# Patient Record
Sex: Male | Born: 1995 | Race: Black or African American | Hispanic: No | Marital: Single | Smoking: Never smoker
Health system: Southern US, Community
[De-identification: ages and names within clinical notes are randomized; demographics above are authoritative.]

## PROBLEM LIST (undated history)

## (undated) DIAGNOSIS — I4891 Unspecified atrial fibrillation: Secondary | ICD-10-CM

## (undated) HISTORY — PX: WISDOM TOOTH EXTRACTION: SHX21

## (undated) HISTORY — DX: Unspecified atrial fibrillation: I48.91

## (undated) HISTORY — DX: Morbid (severe) obesity due to excess calories: E66.01

---
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
## 2015-09-16 ENCOUNTER — Other Ambulatory Visit: Payer: Self-pay

## 2015-09-16 NOTE — Progress Notes (Unsigned)
Prom

## 2018-07-04 ENCOUNTER — Ambulatory Visit: Payer: BC Managed Care – PPO | Admitting: Family Medicine

## 2018-11-06 ENCOUNTER — Other Ambulatory Visit: Payer: Self-pay

## 2018-11-06 ENCOUNTER — Ambulatory Visit (INDEPENDENT_AMBULATORY_CARE_PROVIDER_SITE_OTHER): Payer: BC Managed Care – PPO | Admitting: Family Medicine

## 2018-11-06 VITALS — BP 130/88 | Temp 97.6°F | Wt 287.0 lb

## 2018-11-06 DIAGNOSIS — M778 Other enthesopathies, not elsewhere classified: Secondary | ICD-10-CM

## 2018-11-06 NOTE — Progress Notes (Signed)
   Subjective:    Patient ID: Hunter Simmons, male    DOB: Jul 06, 1995, 23 y.o.   MRN: [id number]  Wrist Pain  The pain is present in the right wrist. This is a new problem. The current episode started 1 to 4 weeks ago. The quality of the pain is described as sharp (pain only when turning wrist). 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 He has tried nothing for the symptoms.  pt states he took this week off and the pain has became better.   Bilateral wrist pain and discomfort hurts with certain movements denies any injury to it denies elbow pain or shoulder pain.  PMH benign  Review of Systems Relates wrist pain but no hand pain no numbness or tingling no shoulder pain no elbow pain chest pain fever chills sweats    Objective:   Physical Exam Lungs clear respiratory rate normal heart is regular.  The wrist subjectively tender bilateral.  No swelling noted.  Decent range of motion.  May do ice packs may do nonrigid wrist brace may also do anti-inflammatories as needed if not dramatically better within 2 weeks to notify us     Assessment & Plan:  Strength is good I believe this is mild tendinitis hopefully his workplace can accommodate him with jobs that are not intensive with the use of the wrist.  To follow-up if progressive troubles or worse

## 2018-11-26 ENCOUNTER — Other Ambulatory Visit: Payer: Self-pay | Admitting: *Deleted

## 2018-11-26 DIAGNOSIS — Z20822 Contact with and (suspected) exposure to covid-19: Secondary | ICD-10-CM

## 2018-11-28 LAB — NOVEL CORONAVIRUS, NAA: SARS-CoV-2, NAA: NOT DETECTED

## 2019-02-26 ENCOUNTER — Other Ambulatory Visit: Payer: Self-pay

## 2019-02-26 ENCOUNTER — Ambulatory Visit: Payer: BC Managed Care – PPO | Attending: Internal Medicine

## 2019-02-26 DIAGNOSIS — Z20822 Contact with and (suspected) exposure to covid-19: Secondary | ICD-10-CM

## 2019-02-27 LAB — NOVEL CORONAVIRUS, NAA: SARS-CoV-2, NAA: NOT DETECTED

## 2019-04-23 ENCOUNTER — Ambulatory Visit: Payer: BC Managed Care – PPO | Attending: Internal Medicine

## 2019-04-23 ENCOUNTER — Other Ambulatory Visit: Payer: Self-pay

## 2019-04-23 DIAGNOSIS — Z20822 Contact with and (suspected) exposure to covid-19: Secondary | ICD-10-CM

## 2019-04-24 ENCOUNTER — Emergency Department (HOSPITAL_COMMUNITY): Payer: BC Managed Care – PPO

## 2019-04-24 ENCOUNTER — Encounter (HOSPITAL_COMMUNITY): Payer: Self-pay | Admitting: Emergency Medicine

## 2019-04-24 ENCOUNTER — Ambulatory Visit
Admission: EM | Admit: 2019-04-24 | Discharge: 2019-04-24 | Disposition: A | Discharge status: Home / Self Care | Payer: BC Managed Care – PPO | Source: Home / Self Care

## 2019-04-24 ENCOUNTER — Other Ambulatory Visit: Payer: Self-pay

## 2019-04-24 ENCOUNTER — Telehealth: Payer: Self-pay | Admitting: *Deleted

## 2019-04-24 ENCOUNTER — Emergency Department (HOSPITAL_COMMUNITY)
Admission: EM | Admit: 2019-04-24 | Discharge: 2019-04-24 | Disposition: A | Discharge status: Home / Self Care | Payer: BC Managed Care – PPO | Attending: Emergency Medicine | Admitting: Emergency Medicine

## 2019-04-24 DIAGNOSIS — U071 COVID-19: Secondary | ICD-10-CM | POA: Diagnosis not present

## 2019-04-24 DIAGNOSIS — J1282 Pneumonia due to coronavirus disease 2019: Secondary | ICD-10-CM | POA: Insufficient documentation

## 2019-04-24 DIAGNOSIS — R072 Precordial pain: Secondary | ICD-10-CM | POA: Diagnosis present

## 2019-04-24 DIAGNOSIS — J189 Pneumonia, unspecified organism: Secondary | ICD-10-CM

## 2019-04-24 LAB — CBC WITH DIFFERENTIAL/PLATELET
Abs Immature Granulocytes: 0.01 10*3/uL (ref 0.00–0.07)
Basophils Absolute: 0 10*3/uL (ref 0.0–0.1)
Basophils Relative: 1 %
Eosinophils Absolute: 0.1 10*3/uL (ref 0.0–0.5)
Eosinophils Relative: 1 %
HCT: 45.2 % (ref 39.0–52.0)
Hemoglobin: 14.1 g/dL (ref 13.0–17.0)
Immature Granulocytes: 0 %
Lymphocytes Relative: 23 %
Lymphs Abs: 1.4 10*3/uL (ref 0.7–4.0)
MCH: 23.4 pg — ABNORMAL LOW (ref 26.0–34.0)
MCHC: 31.2 g/dL (ref 30.0–36.0)
MCV: 75.1 fL — ABNORMAL LOW (ref 80.0–100.0)
Monocytes Absolute: 0.5 10*3/uL (ref 0.1–1.0)
Monocytes Relative: 8 %
Neutro Abs: 4.4 10*3/uL (ref 1.7–7.7)
Neutrophils Relative %: 67 %
Platelets: 164 10*3/uL (ref 150–400)
RBC: 6.02 MIL/uL — ABNORMAL HIGH (ref 4.22–5.81)
RDW: 17 % — ABNORMAL HIGH (ref 11.5–15.5)
WBC: 6.4 10*3/uL (ref 4.0–10.5)
nRBC: 0 % (ref 0.0–0.2)

## 2019-04-24 LAB — BASIC METABOLIC PANEL
Anion gap: 8 (ref 5–15)
BUN: 14 mg/dL (ref 6–20)
CO2: 26 mmol/L (ref 22–32)
Calcium: 8.9 mg/dL (ref 8.9–10.3)
Chloride: 102 mmol/L (ref 98–111)
Creatinine, Ser: 0.88 mg/dL (ref 0.61–1.24)
GFR calc Af Amer: >60 mL/min (ref >60)
GFR calc non Af Amer: >60 mL/min (ref >60)
Glucose, Bld: 106 mg/dL — ABNORMAL HIGH (ref 70–99)
Potassium: 4.2 mmol/L (ref 3.5–5.1)
Sodium: 136 mmol/L (ref 135–145)

## 2019-04-24 LAB — NOVEL CORONAVIRUS, NAA: SARS-CoV-2, NAA: DETECTED — AB

## 2019-04-24 MED ORDER — IBUPROFEN 800 MG PO TABS
800.0000 mg | ORAL_TABLET | Freq: Once | ORAL | Status: AC
  Administered 2019-04-24: 800 mg via ORAL
  Filled 2019-04-24: qty 1

## 2019-04-24 MED ORDER — AZITHROMYCIN 250 MG PO TABS
500.0000 mg | ORAL_TABLET | Freq: Once | ORAL | Status: AC
  Administered 2019-04-24: 500 mg via ORAL
  Filled 2019-04-24: qty 2

## 2019-04-24 MED ORDER — IOHEXOL 350 MG/ML SOLN
100.0000 mL | Freq: Once | INTRAVENOUS | Status: AC | PRN
  Administered 2019-04-24: 19:00:00 100 mL via INTRAVENOUS

## 2019-04-24 MED ORDER — AZITHROMYCIN 250 MG PO TABS: 250.0000 mg | ORAL_TABLET | Freq: Every day | ORAL | 0 refills | Status: AC

## 2019-04-24 MED ORDER — SODIUM CHLORIDE 0.9 % IV SOLN
2.0000 g | Freq: Once | INTRAVENOUS | Status: AC
  Administered 2019-04-24: 2 g via INTRAVENOUS
  Filled 2019-04-24: qty 20

## 2019-04-24 NOTE — Discharge Instructions (Addendum)
Take your next dose of the antibiotic tomorrow evening.  Rest to make sure you are drinking plenty of fluids.  You may take Tylenol or Motrin if needed for fever reduction, this may also help with your chest pain.  Get rechecked as discussed if you develop any worsening symptoms, especially shortness of breath.  You will need to maintain home isolation through next Friday as discussed, however if you continue to have symptoms of fever or any new symptoms you may need to extend this time period until you have been fever free for 3 days.

## 2019-04-24 NOTE — Progress Notes (Signed)
I called and let him know his COVID-19 test result was detected meaning he has the virus and can spread it to others.  I went over the 10 day quarantine protocol.  I let him know he could end quarantine after 10 days as long as his symptoms are resolving and he is fever free for 3 straight days without the use of medication to keep the fever down.  I instructed him to call his PCP or get medical attention at an urgent care of ED if he developed shortness of breath or chest discomfort.   He stated he was having chest discomfort when he coughs or takes a deep breath.   Since he has a PCP I advised him to call him as soon as we get off the phone.   He was agreeable to doing that.   I went over using over-the-counter cough medications, cough drops and sipping warm fluids for the cough.  I went over the cleaning, wearing masks, washing hands frequently and social distancing.   He lives alone.      [facility] Dept notified.

## 2019-04-24 NOTE — ED Provider Notes (Signed)
[facility] EMERGENCY DEPARTMENT Provider Note   CSN: [id number] Arrival date & time: 04/24/19  1503     History Chief Complaint  Patient presents with  . Chest Pain    Hunter Simmons is a 24 y.o. male with no significant past medical history, was diagnosed Covid + per outpatient screening yesterday, presenting with midsternal chest pain which started yesterday with sudden presentation. He denies significant shortness of breath and has had a cough, but nonproductive. Pain is tight feeling and midsternal without radiation into back. No reproducible with movement or palpation. He had a fever to 100.3 yesterday which "broke" today. He denies peripheral edema or extremity pain. Also no n/v, abdominal pain, no loss of taste or smell.  He does endorse generalized fatigue and myalgias.  He was sent from Urgent Care due to concern for possible PE.   The history is provided by the patient.       History reviewed. No pertinent past medical history.  Patient Active Problem List   Diagnosis Date Noted  . GERD (gastroesophageal reflux disease) 08/06/2012    History reviewed. No pertinent surgical history.     No family history on file.  Social History   Tobacco Use  . Smoking status: Never Smoker  . Smokeless tobacco: Never Used  Substance Use Topics  . Alcohol use: No  . Drug use: No    Home Medications Prior to Admission medications   Medication Sig Start Date End Date Taking? Authorizing Provider  azithromycin (ZITHROMAX) 250 MG tablet Take 1 tablet (250 mg total) by mouth daily for 4 days. Take first 2 tablets together, then 1 every day until finished. 04/24/19 04/28/19  Burgess Amor, PA-C    Allergies    Patient has no known allergies.  Review of Systems   Review of Systems  Constitutional: Positive for fever.  HENT: Negative for congestion and sore throat.   Eyes: Negative.   Respiratory: Positive for cough. Negative for chest tightness, shortness of breath, wheezing  and stridor.   Cardiovascular: Positive for chest pain.  Gastrointestinal: Negative for abdominal pain, nausea and vomiting.  Genitourinary: Negative.   Musculoskeletal: Positive for myalgias. Negative for arthralgias, joint swelling and neck pain.  Skin: Negative.  Negative for rash and wound.  Neurological: Negative for dizziness, weakness, light-headedness, numbness and headaches.  Psychiatric/Behavioral: Negative.     Physical Exam Updated Vital Signs BP 127/84   Pulse (!) 101   Temp 98.9 F (37.2 C) (Oral)   Resp 17   Ht 6' (1.829 m)   Wt 129.3 kg   SpO2 100%   BMI 38.65 kg/m   Physical Exam Vitals and nursing note reviewed.  Constitutional:      Appearance: He is well-developed.  HENT:     Head: Normocephalic and atraumatic.  Eyes:     Conjunctiva/sclera: Conjunctivae normal.  Cardiovascular:     Rate and Rhythm: Normal rate and regular rhythm.     Heart sounds: Normal heart sounds.  Pulmonary:     Effort: Pulmonary effort is normal. No accessory muscle usage or respiratory distress.     Breath sounds: Normal breath sounds. No decreased breath sounds, wheezing, rhonchi or rales.     Comments: Distant breath sounds throughout, suspected due to body habitus, no wheezing or rales. Chest:     Chest wall: No tenderness.     Comments: No reproducible chest tenderness. Abdominal:     General: Bowel sounds are normal.     Tenderness: There is no  abdominal tenderness.  Musculoskeletal:        General: Normal range of motion.     Cervical back: Normal range of motion.     Right lower leg: No tenderness. No edema.     Left lower leg: No tenderness. No edema.  Skin:    General: Skin is warm and dry.  Neurological:     General: No focal deficit present.     Mental Status: He is alert.     ED Results / Procedures / Treatments   Labs (all labs ordered are listed, but only abnormal results are displayed) Labs Reviewed  BASIC METABOLIC PANEL - Abnormal; Notable for  the following components:      Result Value   Glucose, Bld 106 (*)    All other components within normal limits  CBC WITH DIFFERENTIAL/PLATELET - Abnormal; Notable for the following components:   RBC 6.02 (*)    MCV 75.1 (*)    MCH 23.4 (*)    RDW 17.0 (*)    All other components within normal limits  CBC WITH DIFFERENTIAL/PLATELET  CBC WITH DIFFERENTIAL/PLATELET    EKG EKG Interpretation  Date/Time:  Friday April 24 2019 15:55:14 EDT Ventricular Rate:  89 PR Interval:    QRS Duration: 86 QT Interval:  322 QTC Calculation: 392 R Axis:   101 Text Interpretation: Sinus rhythm Borderline right axis deviation Borderline repolarization abnormality Borderline ST elevation, lateral leads Confirmed by Bethann Berkshire [phone]) on 04/24/2019 4:48:47 PM   Radiology CT Angio Chest PE W and/or Wo Contrast  Result Date: 04/24/2019 CLINICAL DATA:  Shortness of breath. COVID positive. Pleuritic chest pain. EXAM: CT ANGIOGRAPHY CHEST WITH CONTRAST TECHNIQUE: Multidetector CT imaging of the chest was performed using the standard protocol during bolus administration of intravenous contrast. Multiplanar CT image reconstructions and MIPs were obtained to evaluate the vascular anatomy. CONTRAST:  OMNIPAQUE IOHEXOL 350 MG/ML SOLN COMPARISON:  None. FINDINGS: Cardiovascular: Contrast injection is sufficient to demonstrate satisfactory opacification of the pulmonary arteries to the segmental level. There is no pulmonary embolus. The main pulmonary artery is within normal limits for size. There is no CT evidence of acute right heart strain. The visualized aorta is normal. Heart size is normal, without pericardial effusion. Mediastinum/Nodes: --No mediastinal or hilar lymphadenopathy. --No axillary lymphadenopathy. --No supraclavicular lymphadenopathy. --Normal thyroid gland. --The esophagus is unremarkable Lungs/Pleura: There is an airspace opacity in left upper lobe measuring approximately 3.2 x 3 cm (axial  series 9, image 53). There is suggestion of possible central cavitation. There is an additional airspace opacity measuring approximately 2.3 cm in the left upper lobe. (Axial series 9, image 63). No additional pulmonary opacities are noted. There is no significant pleural effusion. Upper Abdomen: No acute abnormality. Musculoskeletal: Bilateral gynecomastia is noted. There is no displaced fracture. Review of the MIP images confirms the above findings. IMPRESSION: No acute pulmonary embolism. Left upper lobe pneumonia. Follow-up to radiologic resolution is recommended. Electronically Signed   By: Katherine Mantle M.D.   On: 04/24/2019 19:19    Procedures Procedures (including critical care time)  Medications Ordered in ED Medications  ibuprofen (ADVIL) tablet 800 mg (800 mg Oral Given 04/24/19 1635)  iohexol (OMNIPAQUE) 350 MG/ML injection 100 mL (100 mLs Intravenous Contrast Given 04/24/19 1903)  cefTRIAXone (ROCEPHIN) 2 g in sodium chloride 0.9 % 100 mL IVPB (0 g Intravenous Stopped 04/24/19 2051)  azithromycin (ZITHROMAX) tablet 500 mg (500 mg Oral Given 04/24/19 2008)    ED Course  I have reviewed the  triage vital signs and the nursing notes.  Pertinent labs & imaging results that were available during my care of the patient were reviewed by me and considered in my medical decision making (see chart for details).    MDM Rules/Calculators/A&P                     Patient with left upper lobe pneumonia in the setting of COVID-19.  He is in no respiratory distress with normal vital signs here.  He was started on Zithromax and given an IV dose of Rocephin prior to discharge home.  Home treatment and return precautions were outlined.  Also discussed home isolation for 10 days from onset of symptoms.  Work note was given covering him for this duration.    Hunter Simmons was evaluated in Emergency Department on 04/24/2019 for the symptoms described in the history of present illness. He was  evaluated in the context of the global COVID-19 pandemic, which necessitated consideration that the patient might be at risk for infection with the SARS-CoV-2 virus that causes COVID-19. Institutional protocols and algorithms that pertain to the evaluation of patients at risk for COVID-19 are in a state of rapid change based on information released by regulatory bodies including the CDC and federal and state organizations. These policies and algorithms were followed during the patient's care in the ED.  Final Clinical Impression(s) / ED Diagnoses Final diagnoses:  COVID-19  Community acquired pneumonia of left upper lobe of lung    Rx / DC Orders ED Discharge Orders         Ordered    azithromycin (ZITHROMAX) 250 MG tablet  Daily     04/24/19 2026           Evalee Jefferson, PA-C 04/24/19 2355    Milton Ferguson, MD 04/28/19 1021

## 2019-04-24 NOTE — ED Triage Notes (Addendum)
Pt + covid test results today, states he has been  having cp with inspiration that started yesterday , told to come to ED by Urgent Care for CT scan

## 2019-04-24 NOTE — ED Triage Notes (Signed)
Pt reports to UC stating he just tested positive for COVID.  Reports CP with inspiration.  No apparent distress.  VS as charted.  Per provider, pt referred to ED for proper evaluation of symptoms.

## 2019-04-24 NOTE — Telephone Encounter (Signed)
Pt called and states he is positive for covid and started having tightness in his chest today. Advised he should go to urgent care or ED. And he states he will try urgent care. I gave the phone number to him to call.

## 2019-04-27 ENCOUNTER — Encounter: Payer: Self-pay | Admitting: Physician Assistant

## 2019-04-27 ENCOUNTER — Telehealth: Payer: Self-pay | Admitting: Physician Assistant

## 2019-04-27 NOTE — Telephone Encounter (Signed)
Called to discuss with [name] about Covid symptoms and the use of bamlanivimab or casirivimab/imdevimab, a monoclonal antibody infusion for those with mild to moderate Covid symptoms and at a high risk of hospitalization.     Pt is qualified for this infusion at the [facility] infusion center due to co-morbid conditions and/or a member of an at-risk group (BMI >35), however he would like to think it over at this time. Symptoms tier reviewed as well as criteria for ending isolation.  Symptoms reviewed that would warrant ED/Hospital evaluation. Preventative practices reviewed. Patient verbalized understanding. Patient advised to call back if he decides that he does want to get infusion. Callback number to the infusion center given. Patient advised to go to Urgent care or ED with severe symptoms. Last date pt would be eligible for infusion is 05/02/19.     Cline Crock PA-C

## 2019-04-28 ENCOUNTER — Other Ambulatory Visit: Payer: Self-pay

## 2019-04-28 ENCOUNTER — Other Ambulatory Visit: Payer: Self-pay | Admitting: Internal Medicine

## 2019-04-28 ENCOUNTER — Encounter: Payer: Self-pay | Admitting: Family Medicine

## 2019-04-28 ENCOUNTER — Ambulatory Visit (INDEPENDENT_AMBULATORY_CARE_PROVIDER_SITE_OTHER): Payer: BC Managed Care – PPO | Admitting: Family Medicine

## 2019-04-28 DIAGNOSIS — U071 COVID-19: Secondary | ICD-10-CM

## 2019-04-28 MED ORDER — SODIUM CHLORIDE 0.9 % IV SOLN
700.0000 mg | Freq: Once | INTRAVENOUS | Status: AC
  Administered 2019-04-29: 700 mg via INTRAVENOUS
  Filled 2019-04-28: qty 700

## 2019-04-28 NOTE — Progress Notes (Signed)
  I connected by phone with Hunter Simmons on 04/28/2019 at 12:17 PM to discuss the potential use of an new treatment for mild to moderate COVID-19 viral infection in non-hospitalized patients.  This patient is a 24 y.o. male that meets the FDA criteria for Emergency Use Authorization of bamlanivimab or casirivimab\imdevimab.  Has a (+) direct SARS-CoV-2 viral test result  Has mild or moderate COVID-19   Is ? 24 years of age and weighs ? 40 kg  Is NOT hospitalized due to COVID-19  Is NOT requiring oxygen therapy or requiring an increase in baseline oxygen flow rate due to COVID-19  Is within 10 days of symptom onset  Has at least one of the high risk factor(s) for progression to severe COVID-19 and/or hospitalization as defined in EUA.  Specific high risk criteria : BMI >/= 35   I have spoken and communicated the following to the patient or parent/caregiver:  1. FDA has authorized the emergency use of bamlanivimab and casirivimab\imdevimab for the treatment of mild to moderate COVID-19 in adults and pediatric patients with positive results of direct SARS-CoV-2 viral testing who are 23 years of age and older weighing at least 40 kg, and who are at high risk for progressing to severe COVID-19 and/or hospitalization.  2. The significant known and potential risks and benefits of bamlanivimab and casirivimab\imdevimab, and the extent to which such potential risks and benefits are unknown.  3. Information on available alternative treatments and the risks and benefits of those alternatives, including clinical trials.  4. Patients treated with bamlanivimab and casirivimab\imdevimab should continue to self-isolate and use infection control measures (e.g., wear mask, isolate, social distance, avoid sharing personal items, clean and disinfect "high touch" surfaces, and frequent handwashing) according to CDC guidelines.   5. The patient or parent/caregiver has the option to accept or refuse  bamlanivimab or casirivimab\imdevimab .  After reviewing this information with the patient, The patient agreed to proceed with receiving the bamlanimivab infusion and will be provided a copy of the Fact sheet prior to receiving the infusion.   Appointment scheduled for 3/24 at 0930. This will be day 8 of symptoms.   Cyndee Brightly, NP-C [facility]

## 2019-04-28 NOTE — Progress Notes (Signed)
   Subjective:    Patient ID: Hunter Simmons, male    DOB: 1995-12-30, 24 y.o.   MRN: [id number]  HPI pt tested positive for covid 19 on the 18th. Some sob started yesterday.  Dr. Lorin Picket  Patient in the ER was positive for Covid had CT scan which showed left upper lobe pneumonia.  Started having some increased shortness of breath yesterday worse today.  Denies high fever chills sweats.  They did contact him about an infusion and he deferred at that time but we had a long discussion today and he agrees to infusion Review of Systems  Constitutional: Positive for fatigue. Negative for diaphoresis.  HENT: Negative for congestion and rhinorrhea.   Respiratory: Positive for cough and shortness of breath.   Cardiovascular: Negative for chest pain and leg swelling.  Gastrointestinal: Negative for abdominal pain and diarrhea.  Skin: Negative for color change and rash.  Neurological: Negative for dizziness and headaches.  Psychiatric/Behavioral: Negative for behavioral problems and confusion.       Objective:   Physical Exam Bilateral lung exam clear heart regular with rate near 100 extremities no edema skin warm dry O2 saturation 99% temperature 98       Assessment & Plan:  Covid infection We will proceed forward with infusion Patient encouraged to follow-up if progressive troubles He was given O2 sat meter to monitor himself O2 saturation 99% currently Not respiratory distress Warning signs regarding progression of Covid infection discussed and when to go to the ER I did call the infusion clinic and left a detailed message regarding the patient Patient will give [place] updates We will give him work note.

## 2019-04-29 ENCOUNTER — Encounter: Payer: Self-pay | Admitting: Family Medicine

## 2019-04-29 ENCOUNTER — Ambulatory Visit (HOSPITAL_COMMUNITY)
Admission: RE | Admit: 2019-04-29 | Discharge: 2019-04-29 | Disposition: A | Discharge status: Home / Self Care | Payer: BC Managed Care – PPO | Source: Ambulatory Visit | Attending: Pulmonary Disease | Admitting: Pulmonary Disease

## 2019-04-29 DIAGNOSIS — U071 COVID-19: Secondary | ICD-10-CM | POA: Diagnosis present

## 2019-04-29 MED ORDER — EPINEPHRINE 0.3 MG/0.3ML IJ SOAJ: 0.3000 mg | Freq: Once | INTRAMUSCULAR | Status: DC | PRN

## 2019-04-29 MED ORDER — FAMOTIDINE IN NACL 20-0.9 MG/50ML-% IV SOLN: 20.0000 mg | Freq: Once | INTRAVENOUS | Status: DC | PRN

## 2019-04-29 MED ORDER — SODIUM CHLORIDE 0.9 % IV SOLN
INTRAVENOUS | Status: DC | PRN
  Administered 2019-04-29: 09:00:00 250 mL via INTRAVENOUS

## 2019-04-29 MED ORDER — DIPHENHYDRAMINE HCL 50 MG/ML IJ SOLN: 50.0000 mg | Freq: Once | INTRAMUSCULAR | Status: DC | PRN

## 2019-04-29 MED ORDER — ALBUTEROL SULFATE HFA 108 (90 BASE) MCG/ACT IN AERS: 2.0000 | INHALATION_SPRAY | Freq: Once | RESPIRATORY_TRACT | Status: DC | PRN

## 2019-04-29 MED ORDER — METHYLPREDNISOLONE SODIUM SUCC 125 MG IJ SOLR: 125.0000 mg | Freq: Once | INTRAMUSCULAR | Status: DC | PRN

## 2019-04-29 NOTE — Progress Notes (Signed)
  Diagnosis: COVID-19  Physician: DR. Delford Field  Procedure: Covid Infusion Clinic Med: bamlanivimab infusion - Provided patient with bamlanimivab fact sheet for patients, parents and caregivers prior to infusion.  Complications: No immediate complications noted.  Discharge: Discharged home   Hunter Simmons 04/29/2019

## 2019-04-29 NOTE — Telephone Encounter (Signed)
Patient has been set up for infusion

## 2019-04-29 NOTE — Discharge Instructions (Signed)

## 2019-05-01 NOTE — Telephone Encounter (Signed)
error 

## 2020-12-27 IMAGING — CT CT ANGIO CHEST
2 of 6 series · 18 of 46 positions shown · IV contrast (Omnipaque or Isovue)
Comparison: None.

CLINICAL DATA: Shortness of breath. COVID positive. Pleuritic chest
pain.

EXAM:
CT ANGIOGRAPHY CHEST WITH CONTRAST
TECHNIQUE: Multidetector CT imaging of the chest was performed using the
standard protocol during bolus administration of intravenous
contrast. Multiplanar CT image reconstructions and MIPs were
obtained to evaluate the vascular anatomy.
CONTRAST:  100mL OMNIPAQUE IOHEXOL 350 MG/ML SOLN

[Series 8: pe axial thins · axial · 0.83mm/px · z∈[+1154,+1416]mm · 15 of 288 slices shown]
[im 13/288  lung]
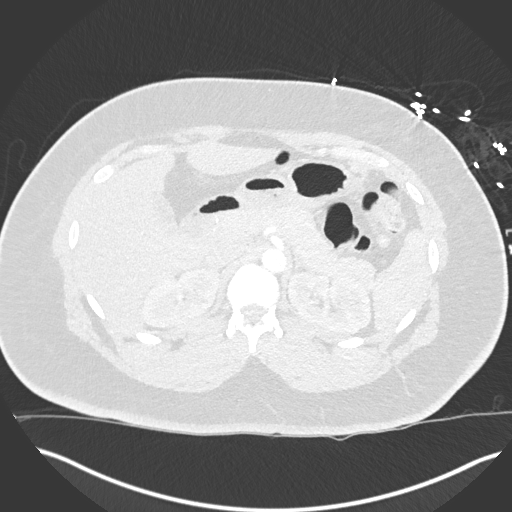
[im 38/288  soft-tissue]
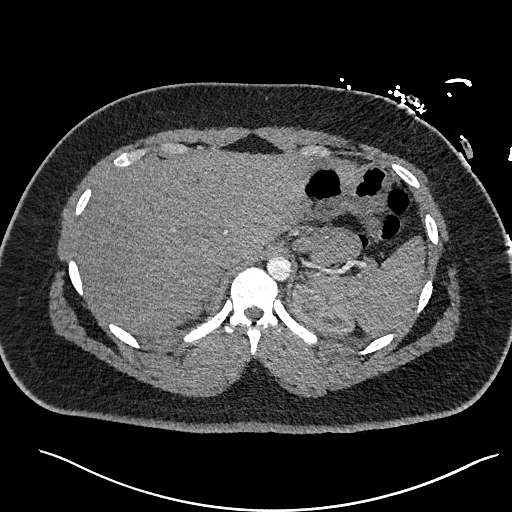
[im 50/288  lung]
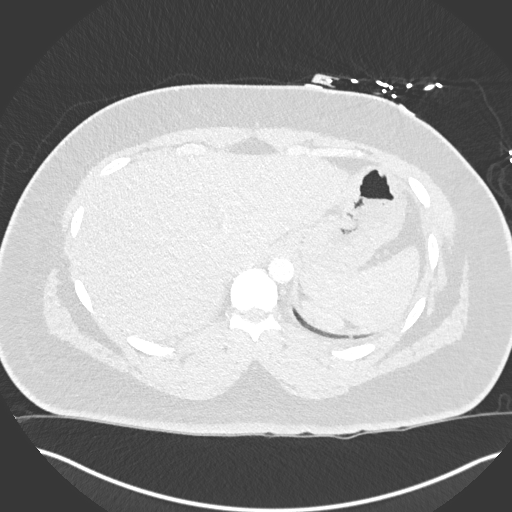
[im 75/288  soft-tissue]
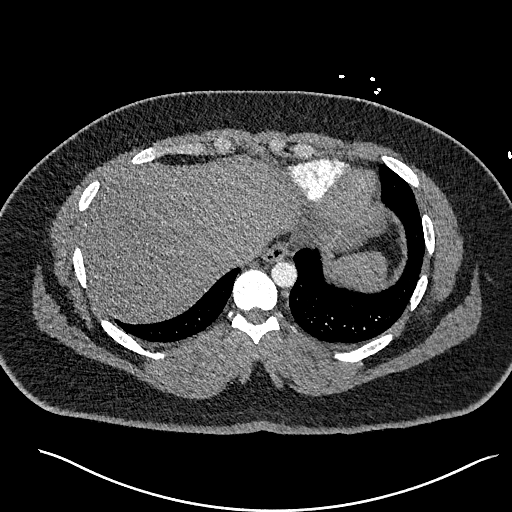
[im 88/288  lung]
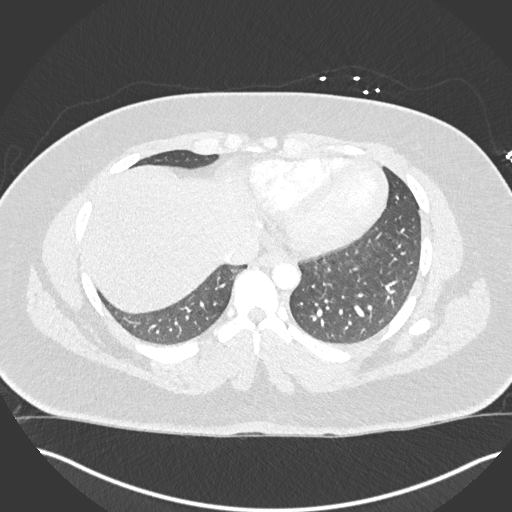
[im 113/288  soft-tissue]
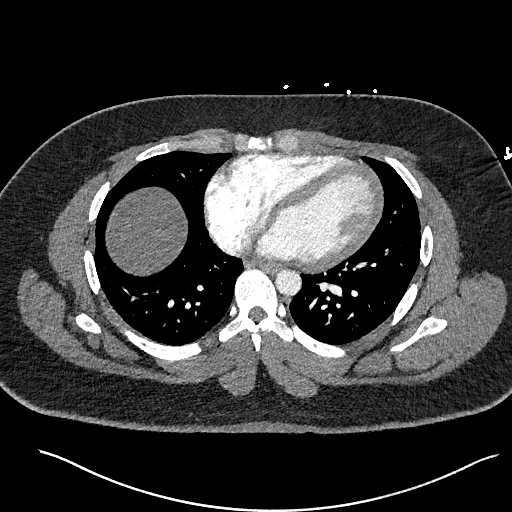
[im 125/288  lung]
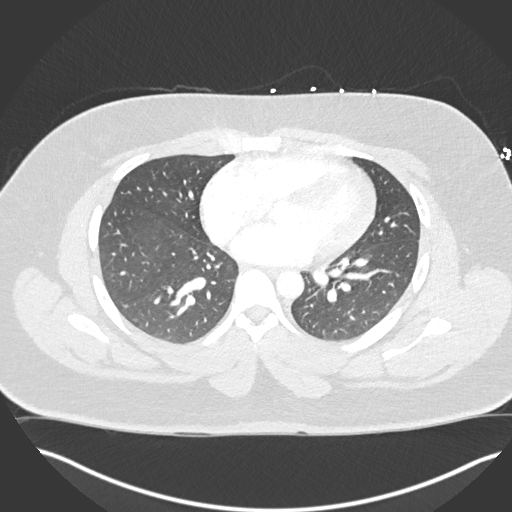
[im 150/288  soft-tissue]
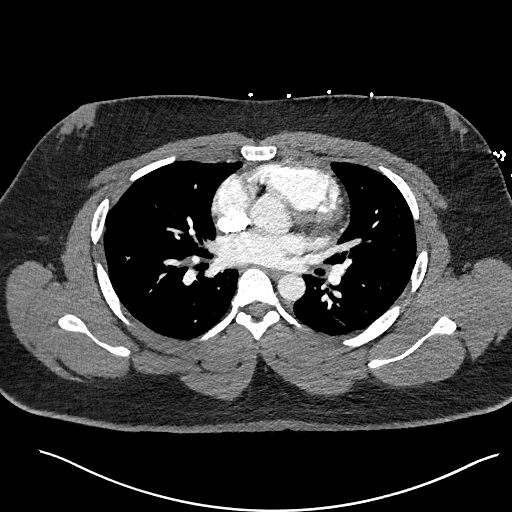
[im 163/288  lung]
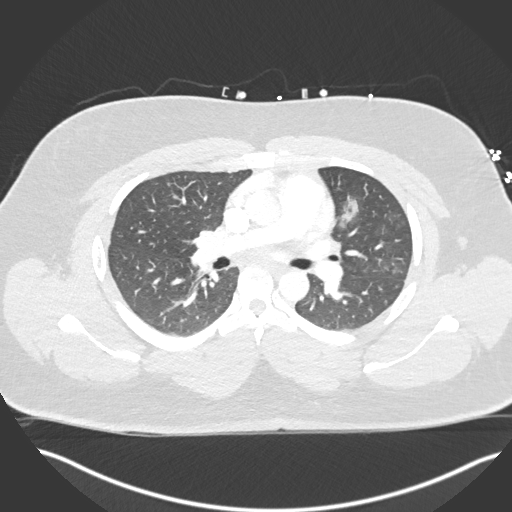
[im 175/288  soft-tissue]
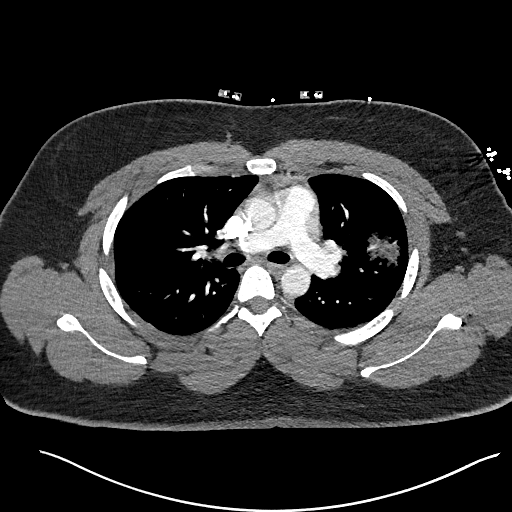
[im 200/288  lung]
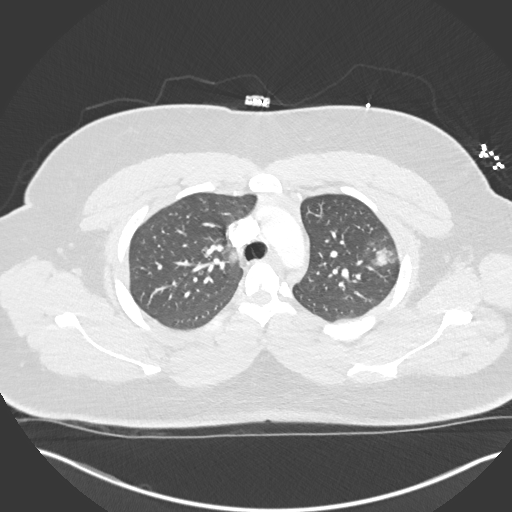
[im 213/288  soft-tissue]
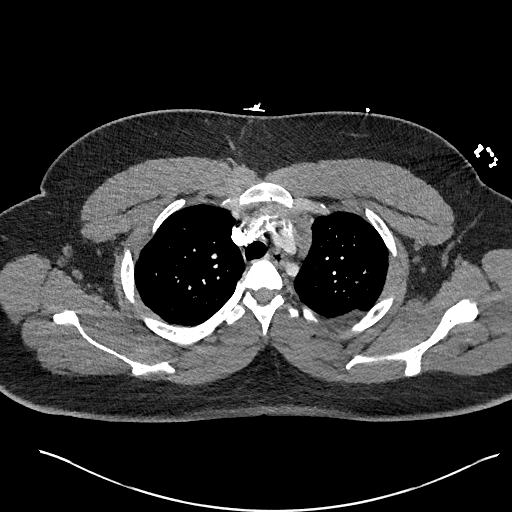
[im 238/288  lung]
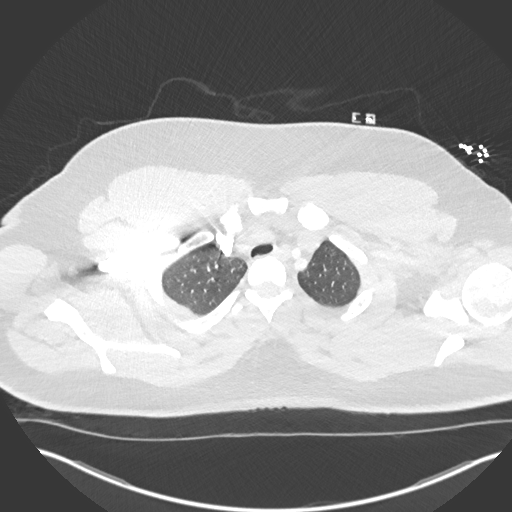
[im 250/288  soft-tissue]
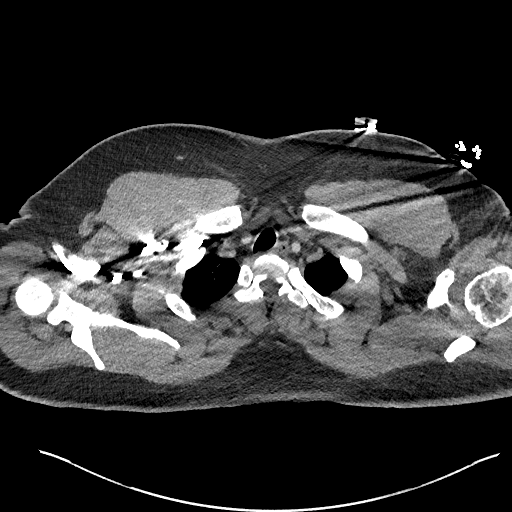
[im 275/288  lung]
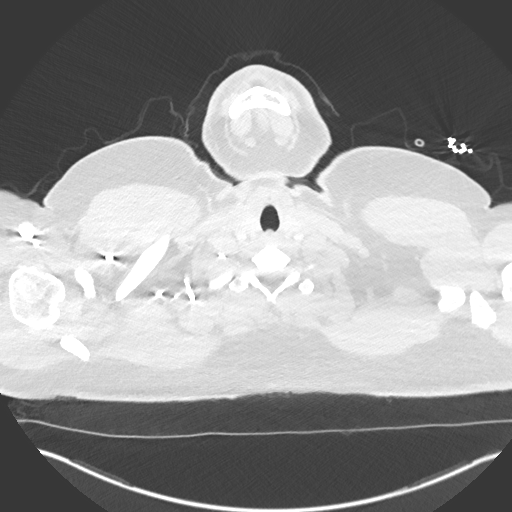

[Series 10: cor soft · coronal · 0.66mm/px · 3 of 148 slices shown]
[im 37/148  soft-tissue]
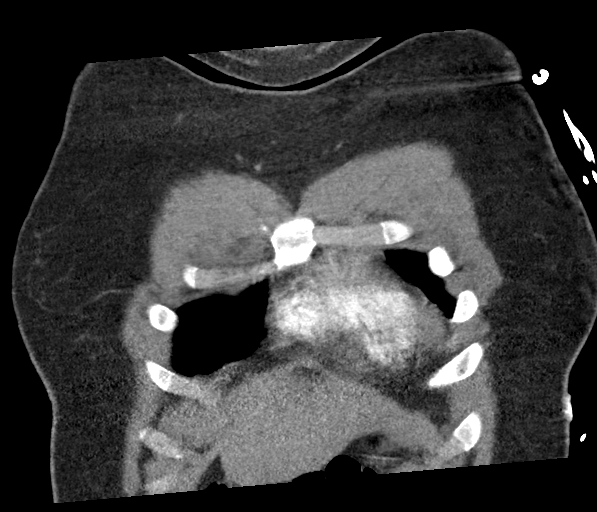
[im 74/148  soft-tissue]
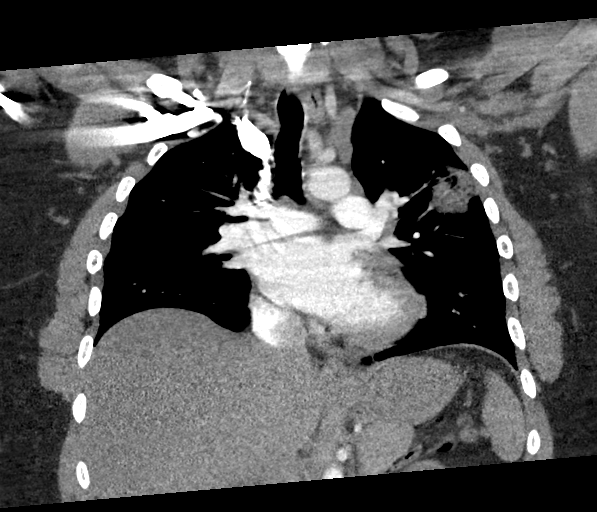
[im 111/148  soft-tissue]
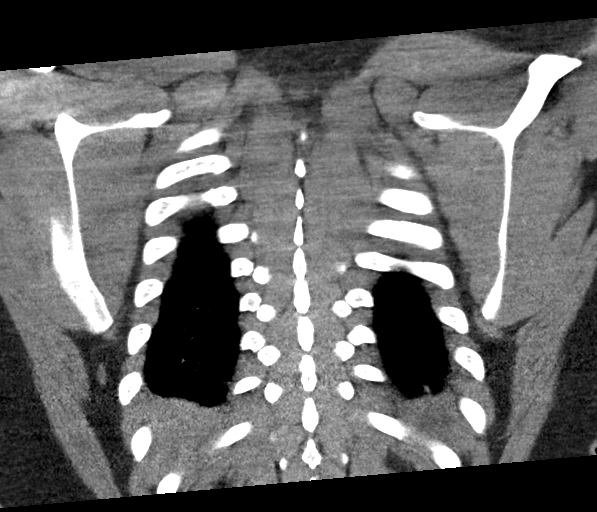

[18 of 46 positions shown; findings below may reference images not displayed]

FINDINGS: Cardiovascular: Contrast injection is sufficient to demonstrate
satisfactory opacification of the pulmonary arteries to the
segmental level. There is no pulmonary embolus. The main pulmonary
artery is within normal limits for size. There is no CT evidence of
acute right heart strain. The visualized aorta is normal. Heart size
is normal, without pericardial effusion.

Mediastinum/Nodes:

--No mediastinal or hilar lymphadenopathy.

--No axillary lymphadenopathy.

--No supraclavicular lymphadenopathy.

--Normal thyroid gland.

--The esophagus is unremarkable

Lungs/Pleura: There is an airspace opacity in left upper lobe
measuring approximately 3.2 x 3 cm (axial series 9, image 53). There
is suggestion of possible central cavitation. There is an additional
airspace opacity measuring approximately 2.3 cm in the left upper
lobe. (Axial series 9, image 63). No additional pulmonary opacities
are noted. There is no significant pleural effusion.

Upper Abdomen: No acute abnormality.

Musculoskeletal: Bilateral gynecomastia is noted. There is no
displaced fracture.

Review of the MIP images confirms the above findings.
IMPRESSION: No acute pulmonary embolism.

Left upper lobe pneumonia. Follow-up to radiologic resolution is
recommended.

## 2021-02-03 ENCOUNTER — Encounter: Payer: Self-pay | Admitting: Family Medicine

## 2021-02-03 ENCOUNTER — Ambulatory Visit (INDEPENDENT_AMBULATORY_CARE_PROVIDER_SITE_OTHER): Payer: BC Managed Care – PPO | Admitting: Family Medicine

## 2021-02-03 VITALS — BP 151/81 | HR 115 | Temp 98.3°F | Ht 72.0 in | Wt 300.0 lb

## 2021-02-03 DIAGNOSIS — R059 Cough, unspecified: Secondary | ICD-10-CM

## 2021-02-03 DIAGNOSIS — R6889 Other general symptoms and signs: Secondary | ICD-10-CM | POA: Diagnosis not present

## 2021-02-03 MED ORDER — OSELTAMIVIR PHOSPHATE 75 MG PO CAPS: 75.0000 mg | ORAL_CAPSULE | Freq: Two times a day (BID) | ORAL | 0 refills | Status: DC

## 2021-02-03 NOTE — Progress Notes (Signed)
° °  Subjective:    Patient ID: Hunter Simmons, male    DOB: [birth year], 25 y.o.   MRN: [id number]  HPI  Pt reports a bad cough , sore throat, lung pain , and tingling in fingers x 3 hrs yesterday  Reports fever of 101 has taken tylenol and mucinex Symptoms over the past couple days Some sore throat not feeling good low energy Body aches headache No wheezing Feels slightly short of breath but O2 saturation very good Review of Systems     Objective:   Physical Exam  No respiratory distress HEENT benign Lungs are clear no crackles heart regular No work through the weekend Stay away from others Hopefully have results back by Monday and may be able to return to work on Tuesday if swab negative and patient feeling better    Assessment & Plan:  Viral syndrome Should gradually get better We will go ahead and treat with Tamiflu twice daily for 5 days to cover for the flu Triple swab taken Await results Will follow-up accordingly

## 2021-02-04 LAB — COVID-19, FLU A+B AND RSV
Influenza A, NAA: DETECTED — AB
Influenza B, NAA: NOT DETECTED
RSV, NAA: NOT DETECTED
SARS-CoV-2, NAA: NOT DETECTED

## 2021-02-14 ENCOUNTER — Emergency Department (HOSPITAL_COMMUNITY): Payer: BC Managed Care – PPO

## 2021-02-14 ENCOUNTER — Encounter (HOSPITAL_COMMUNITY): Payer: Self-pay

## 2021-02-14 ENCOUNTER — Observation Stay (HOSPITAL_COMMUNITY): Payer: BC Managed Care – PPO

## 2021-02-14 ENCOUNTER — Observation Stay (HOSPITAL_COMMUNITY)
Admission: EM | Admit: 2021-02-14 | Discharge: 2021-02-14 | Disposition: A | Discharge status: Home / Self Care | Payer: BC Managed Care – PPO | Attending: Cardiovascular Disease | Admitting: Cardiovascular Disease

## 2021-02-14 DIAGNOSIS — Z20822 Contact with and (suspected) exposure to covid-19: Secondary | ICD-10-CM | POA: Insufficient documentation

## 2021-02-14 DIAGNOSIS — Z79899 Other long term (current) drug therapy: Secondary | ICD-10-CM | POA: Insufficient documentation

## 2021-02-14 DIAGNOSIS — I4891 Unspecified atrial fibrillation: Secondary | ICD-10-CM | POA: Diagnosis not present

## 2021-02-14 DIAGNOSIS — I10 Essential (primary) hypertension: Secondary | ICD-10-CM | POA: Insufficient documentation

## 2021-02-14 DIAGNOSIS — R002 Palpitations: Secondary | ICD-10-CM | POA: Diagnosis present

## 2021-02-14 LAB — BASIC METABOLIC PANEL
Anion gap: 10 (ref 5–15)
BUN: 12 mg/dL (ref 6–20)
CO2: 27 mmol/L (ref 22–32)
Calcium: 9.1 mg/dL (ref 8.9–10.3)
Chloride: 104 mmol/L (ref 98–111)
Creatinine, Ser: 1.17 mg/dL (ref 0.61–1.24)
GFR, Estimated: >60 mL/min (ref >60)
Glucose, Bld: 110 mg/dL — ABNORMAL HIGH (ref 70–99)
Potassium: 3.5 mmol/L (ref 3.5–5.1)
Sodium: 141 mmol/L (ref 135–145)

## 2021-02-14 LAB — RESP PANEL BY RT-PCR (FLU A&B, COVID) ARPGX2
Influenza A by PCR: NEGATIVE
Influenza B by PCR: NEGATIVE
SARS Coronavirus 2 by RT PCR: NEGATIVE

## 2021-02-14 LAB — CBC
HCT: 42.6 % (ref 39.0–52.0)
Hemoglobin: 13 g/dL (ref 13.0–17.0)
MCH: 22.8 pg — ABNORMAL LOW (ref 26.0–34.0)
MCHC: 30.5 g/dL (ref 30.0–36.0)
MCV: 74.7 fL — ABNORMAL LOW (ref 80.0–100.0)
Platelets: 290 10*3/uL (ref 150–400)
RBC: 5.7 MIL/uL (ref 4.22–5.81)
RDW: 16.4 % — ABNORMAL HIGH (ref 11.5–15.5)
WBC: 14.3 10*3/uL — ABNORMAL HIGH (ref 4.0–10.5)
nRBC: 0 % (ref 0.0–0.2)

## 2021-02-14 LAB — ECHOCARDIOGRAM COMPLETE
AR max vel: 3.16 cm2
AV Area VTI: 3.13 cm2
AV Area mean vel: 3.17 cm2
AV Mean grad: 3 mmHg
AV Peak grad: 5.1 mmHg
Ao pk vel: 1.13 m/s
Area-P 1/2: 2.91 cm2
S' Lateral: 3.08 cm

## 2021-02-14 LAB — MAGNESIUM: Magnesium: 2 mg/dL (ref 1.7–2.4)

## 2021-02-14 LAB — TSH: TSH: 7.894 u[IU]/mL — ABNORMAL HIGH (ref 0.350–4.500)

## 2021-02-14 MED ORDER — METOPROLOL SUCCINATE ER 25 MG PO TB24
50.0000 mg | ORAL_TABLET | Freq: Every day | ORAL | Status: DC
  Administered 2021-02-14: 50 mg via ORAL
  Filled 2021-02-14: qty 2

## 2021-02-14 MED ORDER — DILTIAZEM LOAD VIA INFUSION
20.0000 mg | Freq: Once | INTRAVENOUS | Status: AC
  Administered 2021-02-14: 20 mg via INTRAVENOUS
  Filled 2021-02-14: qty 20

## 2021-02-14 MED ORDER — METOPROLOL SUCCINATE ER 50 MG PO TB24: 50.0000 mg | ORAL_TABLET | Freq: Every day | ORAL | Status: DC

## 2021-02-14 MED ORDER — DILTIAZEM HCL-DEXTROSE 125-5 MG/125ML-% IV SOLN (PREMIX)
5.0000 mg/h | INTRAVENOUS | Status: DC
  Administered 2021-02-14: 5 mg/h via INTRAVENOUS
  Filled 2021-02-14: qty 125

## 2021-02-14 MED ORDER — APIXABAN 5 MG PO TABS: 5.0000 mg | ORAL_TABLET | Freq: Two times a day (BID) | ORAL | Status: DC

## 2021-02-14 MED ORDER — DILTIAZEM HCL 60 MG PO TABS
120.0000 mg | ORAL_TABLET | Freq: Two times a day (BID) | ORAL | Status: DC
Start: 2021-02-14 — End: 2021-02-14

## 2021-02-14 MED ORDER — SODIUM CHLORIDE 0.9 % IV BOLUS
1000.0000 mL | Freq: Once | INTRAVENOUS | Status: AC
  Administered 2021-02-14: 1000 mL via INTRAVENOUS

## 2021-02-14 MED ORDER — METOPROLOL TARTRATE 5 MG/5ML IV SOLN
5.0000 mg | Freq: Once | INTRAVENOUS | Status: AC
  Administered 2021-02-14: 5 mg via INTRAVENOUS
  Filled 2021-02-14: qty 5

## 2021-02-14 MED ORDER — ACETAMINOPHEN 650 MG RE SUPP: 650.0000 mg | Freq: Four times a day (QID) | RECTAL | Status: DC | PRN

## 2021-02-14 MED ORDER — APIXABAN 5 MG PO TABS
5.0000 mg | ORAL_TABLET | Freq: Two times a day (BID) | ORAL | Status: DC
  Administered 2021-02-14: 5 mg via ORAL
  Filled 2021-02-14: qty 1

## 2021-02-14 MED ORDER — DILTIAZEM HCL 120 MG PO TABS: 120.0000 mg | ORAL_TABLET | Freq: Two times a day (BID) | ORAL | Status: DC

## 2021-02-14 MED ORDER — SODIUM CHLORIDE 0.9 % IV SOLN: 250.0000 mL | INTRAVENOUS | Status: DC | PRN

## 2021-02-14 MED ORDER — ACETAMINOPHEN 325 MG PO TABS: 650.0000 mg | ORAL_TABLET | Freq: Four times a day (QID) | ORAL | Status: DC | PRN

## 2021-02-14 MED ORDER — SODIUM CHLORIDE 0.9% FLUSH: 3.0000 mL | INTRAVENOUS | Status: DC | PRN

## 2021-02-14 MED ORDER — SODIUM CHLORIDE 0.9% FLUSH
3.0000 mL | Freq: Two times a day (BID) | INTRAVENOUS | Status: DC
  Administered 2021-02-14: 3 mL via INTRAVENOUS

## 2021-02-14 MED ORDER — METOPROLOL SUCCINATE ER 25 MG PO TB24
50.0000 mg | ORAL_TABLET | Freq: Every day | ORAL | Status: DC
Start: 2021-02-14 — End: 2021-02-14

## 2021-02-14 MED ORDER — DILTIAZEM HCL 60 MG PO TABS
120.0000 mg | ORAL_TABLET | Freq: Two times a day (BID) | ORAL | Status: DC
  Administered 2021-02-14: 120 mg via ORAL
  Filled 2021-02-14 (×2): qty 2

## 2021-02-14 NOTE — ED Notes (Signed)
MD Kadakia at bedside.

## 2021-02-14 NOTE — ED Notes (Signed)
MD Algie Coffer notified that pt will need a progressive bed due to pt being on cardizem drip
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
## 2021-02-14 NOTE — Progress Notes (Signed)
Echocardiogram 2D Echocardiogram has been performed.  Hunter Simmons 02/14/2021, 10:07 AM

## 2021-02-14 NOTE — ED Notes (Signed)
MD Algie Coffer notified that pt is maxed on Cardizem at this time - per MD cont cardizem drip and going to give lopressor

## 2021-02-14 NOTE — ED Notes (Signed)
Reviewed discharge instructions with patient and family. Follow-up care and medications reviewed. Patient and family verbalized understanding. Patient A&Ox4, VSS, and ambulatory with steady gait upon discharge.  

## 2021-02-14 NOTE — ED Notes (Signed)
MD paged a 2nd time regarding pt needing a progressive bed due to Cardizem drip
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
## 2021-02-14 NOTE — ED Notes (Signed)
EDP notified that pt is now maxed on cardizem - per EDP waiting on cards at this time

## 2021-02-14 NOTE — ED Provider Notes (Addendum)
[place] EMERGENCY DEPARTMENT Provider Note   CSN: [id number] Arrival date & time: 02/14/21  0329     History  Chief Complaint  Patient presents with   Irregular Heart Beat    Hunter Simmons is a 26 y.o. male.  HPI Patient is a 26 year old male who presents to the emergency department due to palpitations.  Patient states that his friend was broken down on the side of the road so he helped him push his car and began experiencing palpitations while doing this.  He reports associated shortness of breath.  No chest pain, lightheadedness, dizziness, nausea, vomiting, diaphoresis.  Denies history of similar symptoms.  Denies any alcohol use.  Denies drug use.  Denies any regular caffeine use.  Reports history of hypertension on his father side of the family but denies any other known family cardiac history.    Home Medications Prior to Admission medications   Medication Sig Start Date End Date Taking? Authorizing Provider  ibuprofen (ADVIL) 200 MG tablet Take 400 mg by mouth every 6 (six) hours as needed for headache or moderate pain.   Yes [provider]  Phenylephrine-DM-GG-APAP (MUCINEX FAST-MAX SEVERE COLD PO) Take 1 tablet by mouth 2 (two) times daily as needed (cold/cough).   Yes [provider]  Pseudoephedrine-APAP-DM (DAYQUIL PO) Take 30 mLs by mouth daily as needed (cold symptoms).   Yes [provider]  oseltamivir (TAMIFLU) 75 MG capsule Take 1 capsule (75 mg total) by mouth 2 (two) times daily. Patient not taking: Reported on 02/14/2021 02/03/21   Kathyrn Drown, MD      Allergies    Patient has no known allergies.    Review of Systems   Review of Systems  All other systems reviewed and are negative. Ten systems reviewed and are negative for acute change, except as noted in the HPI.   Physical Exam Updated Vital Signs BP (!) 128/94    Pulse 67    Temp 97.7 F (36.5 C) (Oral)    Resp 19    SpO2 100%  Physical  Exam Vitals and nursing note reviewed.  Constitutional:      General: He is not in acute distress.    Appearance: Normal appearance. He is not ill-appearing, toxic-appearing or diaphoretic.  HENT:     Head: Normocephalic and atraumatic.     Right Ear: External ear normal.     Left Ear: External ear normal.     Nose: Nose normal.     Mouth/Throat:     Mouth: Mucous membranes are moist.     Pharynx: Oropharynx is clear. No oropharyngeal exudate or posterior oropharyngeal erythema.  Eyes:     Extraocular Movements: Extraocular movements intact.  Cardiovascular:     Rate and Rhythm: Tachycardia present. Rhythm irregular.     Pulses: Normal pulses.     Heart sounds: Normal heart sounds. No murmur heard.   No friction rub. No gallop.  Pulmonary:     Effort: Pulmonary effort is normal. No respiratory distress.     Breath sounds: Normal breath sounds. No stridor. No wheezing, rhonchi or rales.  Abdominal:     General: Abdomen is flat.     Palpations: Abdomen is soft.     Tenderness: There is no abdominal tenderness.  Musculoskeletal:        General: Normal range of motion.     Cervical back: Normal range of motion and neck supple. No tenderness.  Skin:    General: Skin is  warm and dry.  Neurological:     General: No focal deficit present.     Mental Status: He is alert and oriented to person, place, and time.  Psychiatric:        Mood and Affect: Mood normal.        Behavior: Behavior normal.    ED Results / Procedures / Treatments   Labs (all labs ordered are listed, but only abnormal results are displayed) Labs Reviewed  BASIC METABOLIC PANEL - Abnormal; Notable for the following components:      Result Value   Glucose, Bld 110 (*)    All other components within normal limits  CBC - Abnormal; Notable for the following components:   WBC 14.3 (*)    MCV 74.7 (*)    MCH 22.8 (*)    RDW 16.4 (*)    All other components within normal limits  RESP PANEL BY RT-PCR (FLU A&B,  COVID) ARPGX2  MAGNESIUM  TSH  HIV ANTIBODY (ROUTINE TESTING W REFLEX)    EKG EKG Interpretation  Date/Time:  Tuesday February 14 2021 03:38:04 EST Ventricular Rate:  177 PR Interval:    QRS Duration: 80 QT Interval:  256 QTC Calculation: 439 R Axis:   102 Text Interpretation: Atrial fibrillation with rapid ventricular response with premature ventricular or aberrantly conducted complexes Rightward axis ST & T wave abnormality, consider inferior ischemia Abnormal ECG When compared with ECG of 24-Apr-2019 15:55, PREVIOUS ECG IS PRESENT when compared to prior, faster rate and now appears to have afib with RVR. No STEMI Confirmed by Antony Blackbird [phone]) on 02/14/2021 4:09:06 AM  Radiology DG Chest Port 1 View  Result Date: 02/14/2021 CLINICAL DATA:  Chest pain and irregular heart beat EXAM: PORTABLE CHEST 1 VIEW COMPARISON:  04/24/2019 FINDINGS: Artifact from EKG leads. Normal heart size and mediastinal contours. No acute infiltrate or edema. No effusion or pneumothorax. No acute osseous findings. IMPRESSION: Negative portable chest. Electronically Signed   By: Jorje Guild M.D.   On: 02/14/2021 04:59    Procedures .Critical Care Performed by: Rayna Sexton, PA-C Authorized by: Rayna Sexton, PA-C   Critical care provider statement:    Critical care time (minutes):  30   Critical care was necessary to treat or prevent imminent or life-threatening deterioration of the following conditions: Atrial fibrillation with RVR requiring Cardizem drip.   Critical care was time spent personally by me on the following activities:  Development of treatment plan with patient or surrogate, discussions with consultants, evaluation of patient's response to treatment, examination of patient, ordering and review of laboratory studies, ordering and review of radiographic studies, ordering and performing treatments and interventions, pulse oximetry, re-evaluation of patient's condition and review of  old charts    Medications Ordered in ED Medications  diltiazem (CARDIZEM) 1 mg/mL load via infusion 20 mg (20 mg Intravenous Bolus from Bag 02/14/21 0408)    And  diltiazem (CARDIZEM) 125 mg in dextrose 5% 125 mL (1 mg/mL) infusion (15 mg/hr Intravenous Rate/Dose Change 02/14/21 0521)  apixaban (ELIQUIS) tablet 5 mg (has no administration in time range)  sodium chloride flush (NS) 0.9 % injection 3 mL (has no administration in time range)  sodium chloride flush (NS) 0.9 % injection 3 mL (has no administration in time range)  0.9 %  sodium chloride infusion (has no administration in time range)  acetaminophen (TYLENOL) tablet 650 mg (has no administration in time range)    Or  acetaminophen (TYLENOL) suppository 650 mg (has no administration in  time range)  metoprolol tartrate (LOPRESSOR) injection 5 mg (has no administration in time range)  sodium chloride 0.9 % bolus 1,000 mL (1,000 mLs Intravenous New Bag/Given 02/14/21 0415)    ED Course/ Medical Decision Making/ A&P Clinical Course as of 02/14/21 0557  Tue Feb 14, 2021  0451 Patient discussed with Dr. Doylene Canard with cardiology.  Unsure if this is the first episode of atrial fibrillation.  He is going to evaluate the patient and perform a bedside ultrasound. [LJ]    Clinical Course User Index [LJ] Rayna Sexton, PA-C                           Medical Decision Making Pt is a 26 y.o. male who presents to the emergency department with what appears to be new onset of atrial fibrillation with RVR.  Labs: CBC with a white count of 14.3, MCV of 74.7, MCH of 22.8, RDW of 16.4. BMP with a glucose of 110. Magnesium to 2. TSH is pending.  Imaging: Chest x-ray shows a negative portable chest.  I, Rayna Sexton, PA-C, personally reviewed and evaluated these images and lab results as part of my medical decision-making.  Patient started on Cardizem bolus and infusion.  Rate is improved mildly to between 120 to 150 bpm.  Still irregular.   Patient discussed with Dr. Doylene Canard with cardiology.  He has evaluated the patient and recommends admission.  Patient will be admitted to the cardiology service.  Note: Portions of this report may have been transcribed using voice recognition software. Every effort was made to ensure accuracy; however, inadvertent computerized transcription errors may be present.   Final Clinical Impression(s) / ED Diagnoses Final diagnoses:  Atrial fibrillation with RVR [facility])   Rx / DC Orders ED Discharge Orders          Ordered    Amb referral to AFIB Clinic        02/14/21 0348              Rayna Sexton, PA-C 02/14/21 0557    Rayna Sexton, PA-C 02/14/21 0603    Tegeler, Gwenyth Allegra, MD 02/14/21 [phone]

## 2021-04-04 ENCOUNTER — Telehealth: Payer: Self-pay | Admitting: Family Medicine

## 2021-04-04 ENCOUNTER — Other Ambulatory Visit: Payer: Self-pay

## 2021-04-04 ENCOUNTER — Ambulatory Visit: Payer: BC Managed Care – PPO | Admitting: Family Medicine

## 2021-04-04 VITALS — BP 130/78 | HR 68 | Temp 97.8°F | Ht 72.0 in | Wt 298.0 lb

## 2021-04-04 DIAGNOSIS — Z13220 Encounter for screening for lipoid disorders: Secondary | ICD-10-CM | POA: Diagnosis not present

## 2021-04-04 DIAGNOSIS — Z131 Encounter for screening for diabetes mellitus: Secondary | ICD-10-CM | POA: Diagnosis not present

## 2021-04-04 DIAGNOSIS — R7989 Other specified abnormal findings of blood chemistry: Secondary | ICD-10-CM | POA: Diagnosis not present

## 2021-04-04 NOTE — Telephone Encounter (Signed)
Patient disputing outstanding balance for visit in December, 2022; states he was covered under [employer] at the time. Requesting for bill to be resubmitted to insurance.  [employer] ID #: [id number].  Please advise at [phone].

## 2021-04-04 NOTE — Patient Instructions (Signed)
Hi Hunter Simmons It was good to see you today Glad your heart sounds back  in rhythm.  Please do your blood work within the next 2 weeks we will let you know the results  Work hard at healthy eating and fitting and regular walking  Follow-up will be based upon the results of your lab work  As you discussed earlier please follow-up with cardiology as planned as well.  Should you have any questions or concerns please let us know Thanks-Dr. Lorin Picket

## 2021-04-04 NOTE — Progress Notes (Signed)
° °  Subjective:    Patient ID: Hunter Simmons, male    DOB: 08/31/1995, 26 y.o.   MRN: [id number]  HPI ER and cardiology specialists follow up  Patient had onset of atrial fibrillation with rapid ventricular response which was due to just a chance happening after helping pushing a car.  Denied any fevers cough wheezing or shortness of breath.  Just states very fast heart rate went to the ER was treated went to cardiology they have instructed him to stay on Eliquis for a total of 6 months they will see him back around that time and then potentially do telemetry testing  Review of Systems     Objective:   Physical Exam  Lungs are clear hearts regular HEENT benign heart is regular there is no murmurs or gallops or irregularity      Assessment & Plan:  Morbid obesity patient was encouraged to try to lose weight that would lessen the risk of this happening again  Atrial fibrillation resolved currently  On anticoagulants states he has wisdom teeth coming out in dental specialist wants him off of the Eliquis for 5 days his cardiologist said to be off of it for 3 to 5 days I told him to follow-up with cardiologist that Typically being off of Eliquis for 2 days prior to procedure and restart 1 day after is the normal but certainly patient should follow with a cardiologist stated the likelihood of kicking into atrial fibrillation and having a blood clot during that time is very low  Screening lab work recommended  Wellness exam later this year recommended

## 2021-04-08 LAB — LIPID PANEL
Chol/HDL Ratio: 3.9 ratio (ref 0.0–5.0)
Cholesterol, Total: 160 mg/dL (ref 100–199)
HDL: 41 mg/dL (ref >39)
LDL Chol Calc (NIH): 107 mg/dL — ABNORMAL HIGH (ref 0–99)
Triglycerides: 60 mg/dL (ref 0–149)
VLDL Cholesterol Cal: 12 mg/dL (ref 5–40)

## 2021-04-08 LAB — TSH+T4F+T3FREE
Free T4: 1.25 ng/dL (ref 0.82–1.77)
T3, Free: 3 pg/mL (ref 2.0–4.4)
TSH: 2.42 u[IU]/mL (ref 0.450–4.500)

## 2021-04-08 LAB — GLUCOSE, RANDOM: Glucose: 85 mg/dL (ref 70–99)

## 2021-04-15 ENCOUNTER — Encounter: Payer: Self-pay | Admitting: Family Medicine

## 2021-05-02 NOTE — Telephone Encounter (Signed)
I have added [employer] and it will bill out this evening.  ?

## 2021-06-16 ENCOUNTER — Encounter: Payer: Self-pay | Admitting: Family Medicine

## 2021-06-16 DIAGNOSIS — I4891 Unspecified atrial fibrillation: Secondary | ICD-10-CM

## 2021-06-16 NOTE — Telephone Encounter (Signed)
Nurses ? ?We are happy to try to help add Hunter Simmons.  I do not want him to run out of his medicines. ? ?Please find out from the patient which pharmacy he uses and go ahead and authorize 60 days worth of medications on his medicines. ? ?Please also find out from [place] whether or not he would be available to talk by phone either Monday or Tuesday near lunchtime or near the end of the day? ? ?Then I can discuss his situation with him and see where he wants to go with this issue-thanks ?

## 2021-06-21 ENCOUNTER — Encounter: Payer: Self-pay | Admitting: Family Medicine

## 2021-07-12 ENCOUNTER — Other Ambulatory Visit: Payer: Self-pay | Admitting: Family Medicine

## 2021-07-12 MED ORDER — APIXABAN 5 MG PO TABS: 5.0000 mg | ORAL_TABLET | Freq: Two times a day (BID) | ORAL | 3 refills | Status: DC

## 2021-07-12 NOTE — Telephone Encounter (Signed)
Nurses Patient would like to get established with a different cardiologist Prefers [place] with [facility] but is just requesting a different cardiologist Please have the referral coordinator work with cardiology to have them set him up with a different cardiologist. Apparently there was some mild responsiveness issues but otherwise no bad reason to switch. I told the patient it certainly their prerogative to switch and we will help with initiating this referral  Underlying diagnosis paroxysmal atrial fibrillation  (Should be noted that refills of Eliquis was sent and the patient can follow-up with [place] if having any pressing problems or other issues)

## 2021-08-21 ENCOUNTER — Encounter: Payer: Self-pay | Admitting: Nurse Practitioner

## 2021-08-21 ENCOUNTER — Ambulatory Visit (INDEPENDENT_AMBULATORY_CARE_PROVIDER_SITE_OTHER): Payer: BC Managed Care – PPO | Admitting: Nurse Practitioner

## 2021-08-21 ENCOUNTER — Telehealth: Payer: Self-pay | Admitting: *Deleted

## 2021-08-21 DIAGNOSIS — U071 COVID-19: Secondary | ICD-10-CM | POA: Diagnosis not present

## 2021-08-21 NOTE — Telephone Encounter (Signed)
[name], noseworthy are scheduled for a virtual visit with your provider today.    Just as we do with appointments in the office, we must obtain your consent to participate.  Your consent will be active for this visit and any virtual visit you may have with one of our providers in the next 365 days.    If you have a MyChart account, I can also send a copy of this consent to you electronically.  All virtual visits are billed to your insurance company just like a traditional visit in the office.  As this is a virtual visit, video technology does not allow for your provider to perform a traditional examination.  This may limit your provider's ability to fully assess your condition.  If your provider identifies any concerns that need to be evaluated in person or the need to arrange testing such as labs, EKG, etc, we will make arrangements to do so.    Although advances in technology are sophisticated, we cannot ensure that it will always work on either your end or our end.  If the connection with a video visit is poor, we may have to switch to a telephone visit.  With either a video or telephone visit, we are not always able to ensure that we have a secure connection.   I need to obtain your verbal consent now.   Are you willing to proceed with your visit today?   Hunter Simmons has provided verbal consent on 08/21/2021 for a virtual visit (video or telephone).   Rocco Serene, LPN 10/20/7827  5:62 PM

## 2021-08-21 NOTE — Progress Notes (Signed)
   Subjective:    Patient ID: Hunter Simmons, male    DOB: 1995/02/19, 26 y.o.   MRN: [id number]  HPI   Virtual Visit via Telephone Note  I connected with Hunter Simmons on 08/21/21 at  3:50 PM EDT by telephone and verified that I am speaking with the correct person using two identifiers.  Location: Patient: home  Provider: office   I discussed the limitations, risks, security and privacy concerns of performing an evaluation and management service by telephone and the availability of in person appointments. I also discussed with the patient that there may be a patient responsible charge related to this service. The patient expressed understanding and agreed to proceed.   History of Present Illness:   Patient did at home COVID test on Saturday (08/19/21) and it was positive.  He is having cough, nasal congestion and ran fever on Saturday, up to 103.1.  No difficulty breathing or tightness in chest.  Temperature is back to normal after using acetaminophen.  Observations/Objective: No signs symptoms of distress noted via telephone visit.  Patient able to hold conversation and answer questions appropriately.  No labored breathing or shortness of breath noted via the telephone.  Assessment and Plan:  1. COVID - COVID is detected Under current CDC guidelines it is recommended to stay self isolated for at least 5 days.  If feeling well after 5 days may return to normal activities as long as you wears a mask for 5 days.  If you are not feeling well after 5 days you should stay under self-isolation for 10 days.  Warning signs to watch for if you develops chest tightness shortness of breath severe pain change in mental status you should seek further evaluation in the ER.  If further questions or concerns please let us know  -Continue using ibuprofen and Tylenol as fever reducers as needed -May use Mucinex, nasal lavages for congestion -Go to emergency room if you experience shortness of breath  difficulty breathing or chest tightness -Return to clinic if symptoms worsen or do not improve.  Follow Up Instructions:    I discussed the assessment and treatment plan with the patient. The patient was provided an opportunity to ask questions and all were answered. The patient agreed with the plan and demonstrated an understanding of the instructions.   The patient was advised to call back or seek an in-person evaluation if the symptoms worsen or if the condition fails to improve as anticipated.  I provided 15 minutes of non-face-to-face time during this encounter.    Note:  This document was prepared using Dragon voice recognition software and may include unintentional dictation errors. Note - This record has been created using [employer].  Chart creation errors have been sought, but may not always  have been located. Such creation errors do not reflect on  the standard of medical care.

## 2021-10-27 ENCOUNTER — Encounter: Payer: Self-pay | Admitting: Internal Medicine

## 2021-10-27 ENCOUNTER — Ambulatory Visit: Payer: BC Managed Care – PPO | Attending: Internal Medicine | Admitting: Internal Medicine

## 2021-10-27 VITALS — BP 118/70 | HR 75 | Ht 72.0 in | Wt 287.0 lb

## 2021-10-27 DIAGNOSIS — I4891 Unspecified atrial fibrillation: Secondary | ICD-10-CM | POA: Diagnosis not present

## 2021-10-27 NOTE — Progress Notes (Signed)
Cardiology Office Note   Date:  10/27/2021   ID:  Hunter Simmons, DOB January 12, 1996, MRN [id number]  PCP:  Babs Sciara, MD  Cardiologist:   Dietrich Pates, MD   Patient presents for follow up of atrial fibrillation     History of Present Illness: Hunter Simmons is a 26 y.o. male with a history of atrial fibrillation, HTN, morbid obesity  This past winter, the pt developed a URI. Took pseudofed    In Jan he was helping push a car when he developed tachycardia   Went to [facility] ED     Found to be in Afib with RVR Seen by Thurnell Lose   with BP a little high felt CHADSVASC was 1   Pt placed on Eliquis, diltiazem and metoprolol    Converted on own      Echo done  (See below)  Since that time he has had no further tachycardia  Notes occasional skip.        He is active He says since last winter he has really changed his diet  Cut out a lot of junk food  COoking more  Diet:  Br:   Oatmeal, scrambled eggs, [place] sausage   Br:  Apple juice   or water Lunch (dinner)  12  Baked chicken, salmon, [place] burgers    Mixed veggies     Dinner   Repeat     Drinks  Gatorade, juice,  Sprite   Water       Current Meds  Medication Sig   apixaban (ELIQUIS) 5 MG TABS tablet Take 1 tablet (5 mg total) by mouth 2 (two) times daily.   diltiazem (CARDIZEM) 120 MG tablet Take 1 tablet (120 mg total) by mouth every 12 (twelve) hours.   metoprolol succinate (TOPROL-XL) 50 MG 24 hr tablet Take 1 tablet (50 mg total) by mouth daily.     Allergies:   Patient has no known allergies.   Past Medical History:  Diagnosis Date   Morbid obesity (HCC)     History reviewed. No pertinent surgical history.   Social History:  The patient  reports that he has never smoked. He has never used smokeless tobacco. He reports that he does not drink alcohol and does not use drugs.   Family History:  The patient's family history is not on file.    ROS:  Please see the history of present illness. All other  systems are reviewed and  Negative to the above problem except as noted.    PHYSICAL EXAM: VS:  BP 118/70   Pulse 75   Ht 6' (1.829 m)   Wt 287 lb (130.2 kg)   SpO2 98%   BMI 38.92 kg/m   GEN: Markedly obese 26 yo in no acute distress  HEENT: normal  Neck: no JVD, carotid bruits Cardiac: RRR; no murmurs, No LE  edema  Respiratory:  clear to auscultation bilaterally,  GI: soft, nontender, nondistended, + BS  No hepatomegaly  MS: no deformity Moving all extremities   Skin: warm and dry, no rash Neuro:  Strength and sensation are intact Psych: euthymic mood, full affect   EKG:  EKG is ordered today.  Sinus bradycardia  57 bpm   ST T wave changes consistent with early repolarization    Echo   02/14/21  eft ventricular ejection fraction, by estimation, is 55 to 60%. The left ventricle has normal function. The left ventricle has no regional wall motion abnormalities. Left ventricular  diastolic parameters were normal. 1. 2. Right ventricular systolic function is normal. The right ventricular size is normal. 3. Left atrial size was mildly dilated. 4. The mitral valve is normal in structure. Trivial mitral valve regurgitation. 5. The aortic valve is tricuspid. Aortic valve regurgitation is not visualized. The inferior vena cava is normal in size with greater than 50% respiratory variability, suggesting right atrial pressure of 3 mmHg.  Lipid Panel    Component Value Date/Time   CHOL 160 04/07/2021 0816   TRIG 60 04/07/2021 0816   HDL 41 04/07/2021 0816   CHOLHDL 3.9 04/07/2021 0816   LDLCALC 107 (H) 04/07/2021 0816      Wt Readings from Last 3 Encounters:  10/27/21 287 lb (130.2 kg)  04/04/21 298 lb (135.2 kg)  02/03/21 300 lb (136.1 kg)      ASSESSMENT AND PLAN:  1  PAF   ONly spell of afib occurred in setting of stimulant use and pneumonia (on CT)  Broke  Has not had any recurrence   CHADSVASc score is 0 to 1 (BP mildly elevated on admit to hosp)    Echo showed  normal LV and RV function   Atrial measurements were normal (not dilated) Reviewed this with pt and hs mother    Recomm:  Stop Eliquis Stop diltiazem     Keep on metoprolol   Follow BP  Call if goes over 130s/ Avoid stimulants  2  Blood pressure  Good today   Plan to taper meds and follow   3  Diet   Pt has made marked improvements in his eating   Needs to get rid of SSB and juices Reviewed with pt   Zoe and Metabolics site given for review  Plan for follow up in the spring to see how he is doing, how BP is running         Current medicines are reviewed at length with the patient today.  The patient does not have concerns regarding medicines.  Signed, Dorris Carnes, MD  10/27/2021 9:10 AM    [facility] [address], [place], [place]  [address] Phone: [phone]; Fax: [phone]

## 2021-10-27 NOTE — Patient Instructions (Addendum)
Medication Instructions:   Stop Taking Eliquis  Stop Taking Diltiazem   *If you need a refill on your cardiac medications before your next appointment, please call your pharmacy*   Lab Work: NONE   If you have labs (blood work) drawn today and your tests are completely normal, you will receive your results only by: [place] (if you have MyChart) OR A paper copy in the mail If you have any lab test that is abnormal or we need to change your treatment, we will call you to review the results.   Testing/Procedures: NONE    Follow-Up: At [facility], you and your health needs are our priority.  As part of our continuing mission to provide you with exceptional heart care, we have created designated Provider Care Teams.  These Care Teams include your primary Cardiologist (physician) and Advanced Practice Providers (APPs -  Physician Assistants and Nurse Practitioners) who all work together to provide you with the care you need, when you need it.  We recommend signing up for the patient portal called "MyChart".  Sign up information is provided on this After Visit Summary.  MyChart is used to connect with patients for Virtual Visits (Telemedicine).  Patients are able to view lab/test results, encounter notes, upcoming appointments, etc.  Non-urgent messages can be sent to your provider as well.   To learn more about what you can do with MyChart, go to [email or web address].    Your next appointment:    April    The format for your next appointment:   In Person  Provider:   Dorris Carnes, MD    Other Instructions Thank you for choosing [facility]!     Important Information About Sugar

## 2021-11-21 ENCOUNTER — Encounter: Payer: Self-pay | Admitting: Internal Medicine

## 2021-11-21 DIAGNOSIS — R002 Palpitations: Secondary | ICD-10-CM

## 2021-11-22 ENCOUNTER — Encounter: Payer: Self-pay | Admitting: Student

## 2021-11-22 ENCOUNTER — Other Ambulatory Visit
Admission: RE | Admit: 2021-11-22 | Discharge: 2021-11-22 | Disposition: A | Discharge status: Home / Self Care | Payer: BC Managed Care – PPO | Source: Ambulatory Visit | Attending: Internal Medicine | Admitting: Internal Medicine

## 2021-11-22 ENCOUNTER — Ambulatory Visit (INDEPENDENT_AMBULATORY_CARE_PROVIDER_SITE_OTHER): Payer: BC Managed Care – PPO

## 2021-11-22 ENCOUNTER — Ambulatory Visit: Payer: BC Managed Care – PPO | Admitting: Student

## 2021-11-22 VITALS — BP 118/68 | HR 80 | Ht 72.0 in | Wt 288.0 lb

## 2021-11-22 DIAGNOSIS — R002 Palpitations: Secondary | ICD-10-CM

## 2021-11-22 DIAGNOSIS — Z8679 Personal history of other diseases of the circulatory system: Secondary | ICD-10-CM | POA: Diagnosis not present

## 2021-11-22 LAB — BASIC METABOLIC PANEL
Anion gap: 9 (ref 5–15)
BUN: 13 mg/dL (ref 6–20)
CO2: 25 mmol/L (ref 22–32)
Calcium: 9.3 mg/dL (ref 8.9–10.3)
Chloride: 103 mmol/L (ref 98–111)
Creatinine, Ser: 0.98 mg/dL (ref 0.61–1.24)
GFR, Estimated: >60 mL/min (ref >60)
Glucose, Bld: 85 mg/dL (ref 70–99)
Potassium: 4 mmol/L (ref 3.5–5.1)
Sodium: 137 mmol/L (ref 135–145)

## 2021-11-22 LAB — CBC
HCT: 43.3 % (ref 39.0–52.0)
Hemoglobin: 13.4 g/dL (ref 13.0–17.0)
MCH: 23 pg — ABNORMAL LOW (ref 26.0–34.0)
MCHC: 30.9 g/dL (ref 30.0–36.0)
MCV: 74.4 fL — ABNORMAL LOW (ref 80.0–100.0)
Platelets: 201 10*3/uL (ref 150–400)
RBC: 5.82 MIL/uL — ABNORMAL HIGH (ref 4.22–5.81)
RDW: 17.8 % — ABNORMAL HIGH (ref 11.5–15.5)
WBC: 8.1 10*3/uL (ref 4.0–10.5)
nRBC: 0 % (ref 0.0–0.2)

## 2021-11-22 LAB — MAGNESIUM: Magnesium: 1.7 mg/dL (ref 1.7–2.4)

## 2021-11-22 LAB — TSH: TSH: 2.043 u[IU]/mL (ref 0.350–4.500)

## 2021-11-22 NOTE — Patient Instructions (Signed)
Medication Instructions:  Your physician recommends that you continue on your current medications as directed. Please refer to the Current Medication list given to you today.  *If you need a refill on your cardiac medications before your next appointment, please call your pharmacy*   Lab Work: Your physician recommends that you return for lab work in: Today ( TSH, CBC, BMET, Mg)   If you have labs (blood work) drawn today and your tests are completely normal, you will receive your results only by: MyChart Message (if you have MyChart) OR A paper copy in the mail If you have any lab test that is abnormal or we need to change your treatment, we will call you to review the results.   Testing/Procedures:  Your Monitor has been mailed to your home. Please contact our office if it does not arrive by Friday.    Follow-Up: At [facility], you and your health needs are our priority.  As part of our continuing mission to provide you with exceptional heart care, we have created designated Provider Care Teams.  These Care Teams include your primary Cardiologist (physician) and Advanced Practice Providers (APPs -  Physician Assistants and Nurse Practitioners) who all work together to provide you with the care you need, when you need it.  We recommend signing up for the patient portal called "MyChart".  Sign up information is provided on this After Visit Summary.  MyChart is used to connect with patients for Virtual Visits (Telemedicine).  Patients are able to view lab/test results, encounter notes, upcoming appointments, etc.  Non-urgent messages can be sent to your provider as well.   To learn more about what you can do with MyChart, go to [email or web address].    Your next appointment:   3 month(s)  The format for your next appointment:   In Person  Provider:   Dorris Carnes, MD    Other Instructions Thank you for choosing [facility]!    Important Information About  Sugar

## 2021-11-22 NOTE — Progress Notes (Signed)
Cardiology Office Note    Date:  11/22/2021   ID:  [name], DOB 1996/01/30, MRN [id number]  PCP:  Babs Sciara, MD  Cardiologist: Dietrich Pates, MD    Chief Complaint  Patient presents with   Follow-up    Palpitations    History of Present Illness:    Hunter Simmons is a 26 y.o. male with past medical history of paroxysmal atrial fibrillation (brief episode in 02/2021 while taking pseudoephedrine for a URI and in the setting of physical exertion) who presents to the office today for follow-up of palpitations.  He was most recently examined by Dr. Tenny Craw in 10/2021 and reported occasional palpitations but no symptoms resembling his prior atrial fibrillation. He had not been on anticoagulation given his CHA2DS2-VASc score of 0 and it was recommended to stop Eliquis at that time and Cardizem CD 120 mg twice daily was discontinued as well. He was continued on Toprol-XL 50 mg daily and encouraged to limit caffeine intake.  In talking with the patient and his mom today, he reports his palpitations had overall been well-controlled until this past Monday night. He began to have palpitations at that time and reports possibly having gas as well. Symptoms would last for a few minutes off and on and spontaneously resolve. He was able to go to sleep but had recurrent symptoms the following morning while driving to work and reports intermittent palpitations throughout the morning hours. Symptoms spontaneously resolved and he denies any recurrence yesterday afternoon or today. Says his HR was mostly in the 70's to 80's. No tachycardia. No associated lightheadedness, dizziness or presyncope. No recent chest pain or dyspnea on exertion.  He does not consume caffeine and mostly consumes water at this time. No alcohol use. No recent OTC medications. He reports good compliance with Toprol-XL 50 mg daily and has not missed any doses.   Past Medical History:  Diagnosis Date   Atrial fibrillation (HCC)     Morbid obesity (HCC)     History reviewed. No pertinent surgical history.  Current Medications: Outpatient Medications Prior to Visit  Medication Sig Dispense Refill   metoprolol succinate (TOPROL-XL) 50 MG 24 hr tablet Take 1 tablet (50 mg total) by mouth daily.     No facility-administered medications prior to visit.     Allergies:   Patient has no known allergies.   Social History   Socioeconomic History   Marital status: Single    Spouse name: Not on file   Number of children: Not on file   Years of education: Not on file   Highest education level: Not on file  Occupational History   Not on file  Tobacco Use   Smoking status: Never   Smokeless tobacco: Never  Vaping Use   Vaping Use: Never used  Substance and Sexual Activity   Alcohol use: No   Drug use: No   Sexual activity: Never    Birth control/protection: Abstinence  Other Topics Concern   Not on file  Social History Narrative   Not on file   Social Determinants of Health   Financial Resource Strain: Not on file  Food Insecurity: Not on file  Transportation Needs: Not on file  Physical Activity: Not on file  Stress: Not on file  Social Connections: Not on file     Family History:  The patient's family history is not on file.   Review of Systems:    Please see the history of present illness.  All other systems reviewed and are otherwise negative except as noted above.   Physical Exam:    VS:  BP 118/68   Pulse 80   Ht 6' (1.829 m)   Wt 288 lb (130.6 kg)   SpO2 99%   BMI 39.06 kg/m    General: Well developed, well nourished,male appearing in no acute distress. Head: Normocephalic, atraumatic. Neck: No carotid bruits. JVD not elevated.  Lungs: Respirations regular and unlabored, without wheezes or rales.  Heart: Regular rate and rhythm. No S3 or S4.  No murmur, no rubs, or gallops appreciated. Abdomen: Appears non-distended. No obvious abdominal masses. Msk:  Strength and tone  appear normal for age. No obvious joint deformities or effusions. Extremities: No clubbing or cyanosis. No pitting edema.  Distal pedal pulses are 2+ bilaterally. Neuro: Alert and oriented X 3. Moves all extremities spontaneously. No focal deficits noted. Psych:  Responds to questions appropriately with a normal affect. Skin: No rashes or lesions noted  Wt Readings from Last 3 Encounters:  11/22/21 288 lb (130.6 kg)  10/27/21 287 lb (130.2 kg)  04/04/21 298 lb (135.2 kg)     Studies/Labs Reviewed:   EKG:  EKG is not ordered today. EKG from 11/13/2021 is reviewed and demonstrates sinus bradycardia, HR 57 with sinus arrhythmia and RAD. No acute ST changes.   Recent Labs: 02/14/2021: BUN 12; Creatinine, Ser 1.17; Hemoglobin 13.0; Magnesium 2.0; Platelets 290; Potassium 3.5; Sodium 141 04/07/2021: TSH 2.420   Lipid Panel    Component Value Date/Time   CHOL 160 04/07/2021 0816   TRIG 60 04/07/2021 0816   HDL 41 04/07/2021 0816   CHOLHDL 3.9 04/07/2021 0816   LDLCALC 107 (H) 04/07/2021 0816    Additional studies/ records that were reviewed today include:   Echocardiogram: 02/2021 IMPRESSIONS     1. Left ventricular ejection fraction, by estimation, is 55 to 60%. The  left ventricle has normal function. The left ventricle has no regional  wall motion abnormalities. Left ventricular diastolic parameters were  normal.   2. Right ventricular systolic function is normal. The right ventricular  size is normal.   3. Left atrial size was mildly dilated.   4. The mitral valve is normal in structure. Trivial mitral valve  regurgitation.   5. The aortic valve is tricuspid. Aortic valve regurgitation is not  visualized.   6. The inferior vena cava is normal in size with greater than 50%  respiratory variability, suggesting right atrial pressure of 3 mmHg.   Assessment:    1. Palpitations   2. History of atrial fibrillation      Plan:   In order of problems listed above:  1.  Palpitations/History of Paroxysmal Atrial Fibrillation - He previously had atrial fibrillation in 02/2021 with no known recurrence since but has experienced intermittent palpitations over the past few days. Unaware of any specific triggers. He denies any symptoms today. - Dr. Tenny Craw previously addressed his symptoms in a MyChart message and recommended a 2-week Zio patch. Confirmed with office staff that this has already been mailed to his home and I encouraged him to reach out if he has not received this within a few days or needs help with placement as we can arrange for a nurse visit. Will check labs including CBC, BMET, TSH and Mg. - Continue Toprol-XL 50mg  daily for now. Did not adjust dosing today given his HR in the 50's at times by his most recent EKG. If he continues to have break-through episodes prior to  his monitor resulting, can consider the use of PRN short-acting Lopressor.    Medication Adjustments/Labs and Tests Ordered: Current medicines are reviewed at length with the patient today.  Concerns regarding medicines are outlined above.  Medication changes, Labs and Tests ordered today are listed in the Patient Instructions below. Patient Instructions  Medication Instructions:  Your physician recommends that you continue on your current medications as directed. Please refer to the Current Medication list given to you today.  *If you need a refill on your cardiac medications before your next appointment, please call your pharmacy*   Lab Work: Your physician recommends that you return for lab work in: Today ( TSH, CBC, BMET, Mg)   If you have labs (blood work) drawn today and your tests are completely normal, you will receive your results only by: MyChart Message (if you have MyChart) OR A paper copy in the mail If you have any lab test that is abnormal or we need to change your treatment, we will call you to review the results.   Testing/Procedures:  Your Monitor has been mailed  to your home. Please contact our office if it does not arrive by Friday.    Follow-Up: At [facility], you and your health needs are our priority.  As part of our continuing mission to provide you with exceptional heart care, we have created designated Provider Care Teams.  These Care Teams include your primary Cardiologist (physician) and Advanced Practice Providers (APPs -  Physician Assistants and Nurse Practitioners) who all work together to provide you with the care you need, when you need it.  We recommend signing up for the patient portal called "MyChart".  Sign up information is provided on this After Visit Summary.  MyChart is used to connect with patients for Virtual Visits (Telemedicine).  Patients are able to view lab/test results, encounter notes, upcoming appointments, etc.  Non-urgent messages can be sent to your provider as well.   To learn more about what you can do with MyChart, go to [email or web address].    Your next appointment:   3 month(s)  The format for your next appointment:   In Person  Provider:   Dorris Carnes, MD    Other Instructions Thank you for choosing [facility]!    Important Information About Sugar         Signed, Erma Heritage, PA-C  11/22/2021 2:00 PM    [facility] [address] [place], [place] [address] Phone: [phone] Fax: [phone]

## 2021-11-22 NOTE — Progress Notes (Unsigned)
Enrolled for [employer] to mail a ZIO XT long term holter monitor to the patients address on file.  

## 2021-11-22 NOTE — Telephone Encounter (Signed)
Lets set him up for a 2 wk monitor to capture this fluttering

## 2021-11-25 DIAGNOSIS — R002 Palpitations: Secondary | ICD-10-CM

## 2021-11-30 ENCOUNTER — Telehealth: Payer: Self-pay

## 2021-11-30 DIAGNOSIS — Z79899 Other long term (current) drug therapy: Secondary | ICD-10-CM

## 2021-11-30 MED ORDER — MAGNESIUM OXIDE 400 MG PO CAPS: 400.0000 mg | ORAL_CAPSULE | Freq: Every day | ORAL | 3 refills | Status: DC

## 2021-11-30 NOTE — Telephone Encounter (Signed)
Results discussed with patient.He agrees to start magnesium oxide 400 mg qd, he will get it OTC and repeat magnesium level in 1 month at [facility]

## 2021-11-30 NOTE — Telephone Encounter (Signed)
-----   Message from Erma Heritage, [place] sent at 11/22/2021  4:05 PM EDT ----- Please let the patient know his sodium, potassium, hemoglobin and platelets are within a normal range. Thyroid function is normal as well. His magnesium is at the low end of normal and this was previously perfectly normal earlier this year. Would recommend the addition of magnesium oxide 400 mg daily and this can be sent in or picked up OTC. Would recheck magnesium level in 3-4 weeks.

## 2021-12-11 NOTE — Telephone Encounter (Signed)
Please write refills for Toprol and other cardiac meds

## 2021-12-12 MED ORDER — METOPROLOL SUCCINATE ER 50 MG PO TB24: 50.0000 mg | ORAL_TABLET | Freq: Every day | ORAL | 1 refills | Status: DC

## 2021-12-12 NOTE — Addendum Note (Signed)
Addended by: Stephani Police on: 12/12/2021 10:45 AM   Modules accepted: Orders

## 2021-12-13 ENCOUNTER — Encounter: Payer: Self-pay | Admitting: Internal Medicine

## 2022-02-12 NOTE — Progress Notes (Unsigned)
Cardiology Office Note   Date:  02/13/2022   ID:  Hunter Simmons, DOB July 17, 1995, MRN [id number]  PCP:  Hunter Sciara, MD  Cardiologist:   Hunter Pates, MD   Patient presents for follow up of atrial fibrillation     History of Present Illness: Hunter Simmons is a 27 y.o. male with a history of atrial fibrillation, HTN, morbid obesity   In Dec 2022 the pt developed a URI. Took pseudofed    In Jan he was helping push a car when he developed tachycardia   Went to [facility] ED     Found to be in Afib with RVR  Seen by Hunter Simmons   with BP a little high felt CHADSVASC was 1   Pt placed on Eliquis, diltiazem and metoprolol    Converted on own      Echo done  (See below)  I saw the pt in Sept 2023   She saw Hunter Simmons in Oct 2023  He had had  palpitations prior to visit   Episodes lasted a few min  On and off    He wore a monitor after clinci visit  for 2 wks   No arrhythmas found     Since seen  He has had no further spells   His breathing is OK   No dizziness  No CP THinking of working out soon   Working some on diet     Current Meds  Medication Sig   Magnesium Oxide 400 MG CAPS Take 1 capsule (400 mg total) by mouth daily.   metoprolol succinate (TOPROL-XL) 50 MG 24 hr tablet Take 1 tablet (50 mg total) by mouth daily.     Allergies:   Patient has no known allergies.   Past Medical History:  Diagnosis Date   Atrial fibrillation (HCC)    Morbid obesity (HCC)     History reviewed. No pertinent surgical history.   Social History:  The patient  reports that he has never smoked. He has never used smokeless tobacco. He reports current alcohol use of about 1.0 standard drink of alcohol per week. He reports that he does not use drugs.   Family History:  The patient's family history includes Hypertension in his father.    ROS:  Please see the history of present illness. All other systems are reviewed and  Negative to the above problem except as noted.    PHYSICAL EXAM: VS:  BP  126/78   Pulse 88   Ht 6' (1.829 m)   Wt 293 lb 6.4 oz (133.1 kg)   SpO2 98%   BMI 39.79 kg/m   GEN: Markedly obese 27 yo in no acute distress  HEENT: normal  Neck: no JVD, no carotid bruit Cardiac: RRR; no murmurs, No LE edema  Respiratory:  clear to auscultation bilaterally,  GI: soft, nontender, nondistended, + BS  No hepatomegaly  MS: no deformity Moving all extremities   Skin: warm and dry, no rash Neuro:  Strength and sensation are intact Psych: euthymic mood, full affect   EKG:  EKG is not ordered today.    Echo   02/14/21  eft ventricular ejection fraction, by estimation, is 55 to 60%. The left ventricle has normal function. The left ventricle has no regional wall motion abnormalities. Left ventricular diastolic parameters were normal. 1. 2. Right ventricular systolic function is normal. The right ventricular size is normal. 3. Left atrial size was mildly dilated. 4. The mitral valve is  normal in structure. Trivial mitral valve regurgitation. 5. The aortic valve is tricuspid. Aortic valve regurgitation is not visualized. The inferior vena cava is normal in size with greater than 50% respiratory variability, suggesting right atrial pressure of 3 mmHg.  Lipid Panel    Component Value Date/Time   CHOL 160 04/07/2021 0816   TRIG 60 04/07/2021 0816   HDL 41 04/07/2021 0816   CHOLHDL 3.9 04/07/2021 0816   LDLCALC 107 (H) 04/07/2021 0816      Wt Readings from Last 3 Encounters:  02/13/22 293 lb 6.4 oz (133.1 kg)  11/22/21 288 lb (130.6 kg)  10/27/21 287 lb (130.2 kg)      ASSESSMENT AND PLAN:  1  PAF  Pt has had one documented spell of afib after use of stimulant use, when sick with a pneumonia.    CHADSVASc is 0.   Echo normal  He had a few spells in October that he was concerned were afib   Very short lived   I would recomm he get a [place] to capture He can stop metoprolol   Use as needed .      Forward strips if has more spells  to hosp)     Reviewed this with pt and hs mother   2  Blood pressure  Good today   Follow off of meds   3  Diet  REviewed again with patient   Cut out SSB   Whole, natural foods   Follow up later this year      Current medicines are reviewed at length with the patient today.  The patient does not have concerns regarding medicines.  Signed, Dorris Carnes, MD  02/13/2022 1:21 PM    [facility] [place], [place], [place]  [address] Phone: [phone]; Fax: [phone]

## 2022-02-13 ENCOUNTER — Ambulatory Visit: Payer: Self-pay | Attending: Internal Medicine | Admitting: Internal Medicine

## 2022-02-13 ENCOUNTER — Encounter: Payer: Self-pay | Admitting: Internal Medicine

## 2022-02-13 VITALS — BP 126/78 | HR 88 | Ht 72.0 in | Wt 293.4 lb

## 2022-02-13 DIAGNOSIS — I480 Paroxysmal atrial fibrillation: Secondary | ICD-10-CM

## 2022-02-13 NOTE — Patient Instructions (Signed)
Medication Instructions:   Stop Taking Metoprolol Succinate   *If you need a refill on your cardiac medications before your next appointment, please call your pharmacy*   Lab Work: NONE   If you have labs (blood work) drawn today and your tests are completely normal, you will receive your results only by: [place] (if you have MyChart) OR A paper copy in the mail If you have any lab test that is abnormal or we need to change your treatment, we will call you to review the results.   Testing/Procedures: NONE    Follow-Up: At [facility], you and your health needs are our priority.  As part of our continuing mission to provide you with exceptional heart care, we have created designated Provider Care Teams.  These Care Teams include your primary Cardiologist (physician) and Advanced Practice Providers (APPs -  Physician Assistants and Nurse Practitioners) who all work together to provide you with the care you need, when you need it.  We recommend signing up for the patient portal called "MyChart".  Sign up information is provided on this After Visit Summary.  MyChart is used to connect with patients for Virtual Visits (Telemedicine).  Patients are able to view lab/test results, encounter notes, upcoming appointments, etc.  Non-urgent messages can be sent to your provider as well.   To learn more about what you can do with MyChart, go to [email or web address].    Your next appointment:    September   The format for your next appointment:   In Person  Provider:   Dorris Carnes, MD    Other Instructions Thank you for choosing [facility]!    Important Information About Sugar

## 2022-02-15 ENCOUNTER — Encounter: Payer: Self-pay | Admitting: Internal Medicine

## 2022-02-20 ENCOUNTER — Ambulatory Visit (INDEPENDENT_AMBULATORY_CARE_PROVIDER_SITE_OTHER): Payer: Self-pay | Admitting: Family Medicine

## 2022-02-20 ENCOUNTER — Encounter: Payer: Self-pay | Admitting: Family Medicine

## 2022-02-20 VITALS — BP 136/85 | HR 80 | Temp 99.2°F | Ht 72.0 in | Wt 296.0 lb

## 2022-02-20 DIAGNOSIS — J209 Acute bronchitis, unspecified: Secondary | ICD-10-CM

## 2022-02-20 MED ORDER — PROMETHAZINE-DM 6.25-15 MG/5ML PO SYRP: 5.0000 mL | ORAL_SOLUTION | Freq: Four times a day (QID) | ORAL | 0 refills | Status: DC | PRN

## 2022-02-20 MED ORDER — AMOXICILLIN-POT CLAVULANATE 875-125 MG PO TABS: 1.0000 | ORAL_TABLET | Freq: Two times a day (BID) | ORAL | 0 refills | Status: DC

## 2022-02-20 NOTE — Telephone Encounter (Signed)
I would recomm trying Robitussin DM before phenergan DM first   Phenergan probably wont cause afib I wouldn't use it as first choice  Try OTC first

## 2022-02-20 NOTE — Assessment & Plan Note (Signed)
Treating with Augmentin and Promethazine DM. 

## 2022-02-20 NOTE — Progress Notes (Signed)
Subjective:  Patient ID: Hunter Simmons, male    DOB: 1995/12/24  Age: 27 y.o. MRN: [id number]  CC: Chief Complaint  Patient presents with   Cough    Productive x 1 week , negative covid test last week    HPI:  27 year old male presents for evaluation of the above.  Patient states that he has been sick since last Sunday.  He said low-grade temp, ongoing cough and congestion.  He is now having some chest discomfort due to the significant cough.  He has tested negative for COVID at home.  He has been taking over-the-counter medication without relief.  No other associated symptoms.  No other complaints.  Patient Active Problem List   Diagnosis Date Noted   Acute bronchitis 02/20/2022   Morbid obesity ([place]) 04/04/2021   Atrial fibrillation with RVR ([place]) 02/14/2021   GERD (gastroesophageal reflux disease) 08/06/2012    Social Hx   Social History   Socioeconomic History   Marital status: Single    Spouse name: Not on file   Number of children: Not on file   Years of education: Not on file   Highest education level: Not on file  Occupational History   Not on file  Tobacco Use   Smoking status: Never   Smokeless tobacco: Never  Vaping Use   Vaping Use: Never used  Substance and Sexual Activity   Alcohol use: Yes    Alcohol/week: 1.0 standard drink of alcohol    Types: 1 Shots of liquor per week   Drug use: No   Sexual activity: Never    Birth control/protection: Abstinence  Other Topics Concern   Not on file  Social History Narrative   Not on file   Social Determinants of Health   Financial Resource Strain: Not on file  Food Insecurity: Not on file  Transportation Needs: Not on file  Physical Activity: Not on file  Stress: Not on file  Social Connections: Not on file    Review of Systems Per HPI  Objective:  BP 136/85   Pulse 80   Temp 99.2 F (37.3 C)   Ht 6' (1.829 m)   Wt 296 lb (134.3 kg)   SpO2 100%   BMI 40.14 kg/m      02/20/2022    9:49  AM 02/13/2022    1:06 PM 11/22/2021    1:18 PM  BP/Weight  Systolic BP 341 937 902  Diastolic BP 85 78 68  Wt. (Lbs) 296 293.4 288  BMI 40.14 kg/m2 39.79 kg/m2 39.06 kg/m2    Physical Exam Vitals and nursing note reviewed.  Constitutional:      General: He is not in acute distress.    Appearance: Normal appearance. He is obese.  HENT:     Head: Normocephalic and atraumatic.     Right Ear: Tympanic membrane normal.     Left Ear: Tympanic membrane normal.     Mouth/Throat:     Pharynx: Oropharynx is clear.  Cardiovascular:     Rate and Rhythm: Normal rate and regular rhythm.  Pulmonary:     Effort: Pulmonary effort is normal.     Breath sounds: Normal breath sounds. No wheezing, rhonchi or rales.  Neurological:     Mental Status: He is alert.     Lab Results  Component Value Date   WBC 8.1 11/22/2021   HGB 13.4 11/22/2021   HCT 43.3 11/22/2021   PLT 201 11/22/2021   GLUCOSE 85 11/22/2021   CHOL 160  04/07/2021   TRIG 60 04/07/2021   HDL 41 04/07/2021   LDLCALC 107 (H) 04/07/2021   NA 137 11/22/2021   K 4.0 11/22/2021   CL 103 11/22/2021   CREATININE 0.98 11/22/2021   BUN 13 11/22/2021   CO2 25 11/22/2021   TSH 2.043 11/22/2021     Assessment & Plan:   Problem List Items Addressed This Visit       Respiratory   Acute bronchitis - Primary    Treating with Augmentin and Promethazine DM.       Meds ordered this encounter  Medications   amoxicillin-clavulanate (AUGMENTIN) 875-125 MG tablet    Sig: Take 1 tablet by mouth 2 (two) times daily.    Dispense:  14 tablet    Refill:  0   promethazine-dextromethorphan (PROMETHAZINE-DM) 6.25-15 MG/5ML syrup    Sig: Take 5 mLs by mouth 4 (four) times daily as needed.    Dispense:  118 mL    Refill:  [place]

## 2022-02-22 ENCOUNTER — Ambulatory Visit: Payer: Self-pay | Admitting: Family Medicine

## 2022-10-11 ENCOUNTER — Ambulatory Visit: Payer: Self-pay | Admitting: Internal Medicine

## 2022-10-20 IMAGING — DX DG CHEST 1V PORT
1 series · 1 of 1 positions shown · non-contrast
Comparison: 04/24/2019

CLINICAL DATA: Chest pain and irregular heart beat

EXAM:
PORTABLE CHEST 1 VIEW

[chest ap]
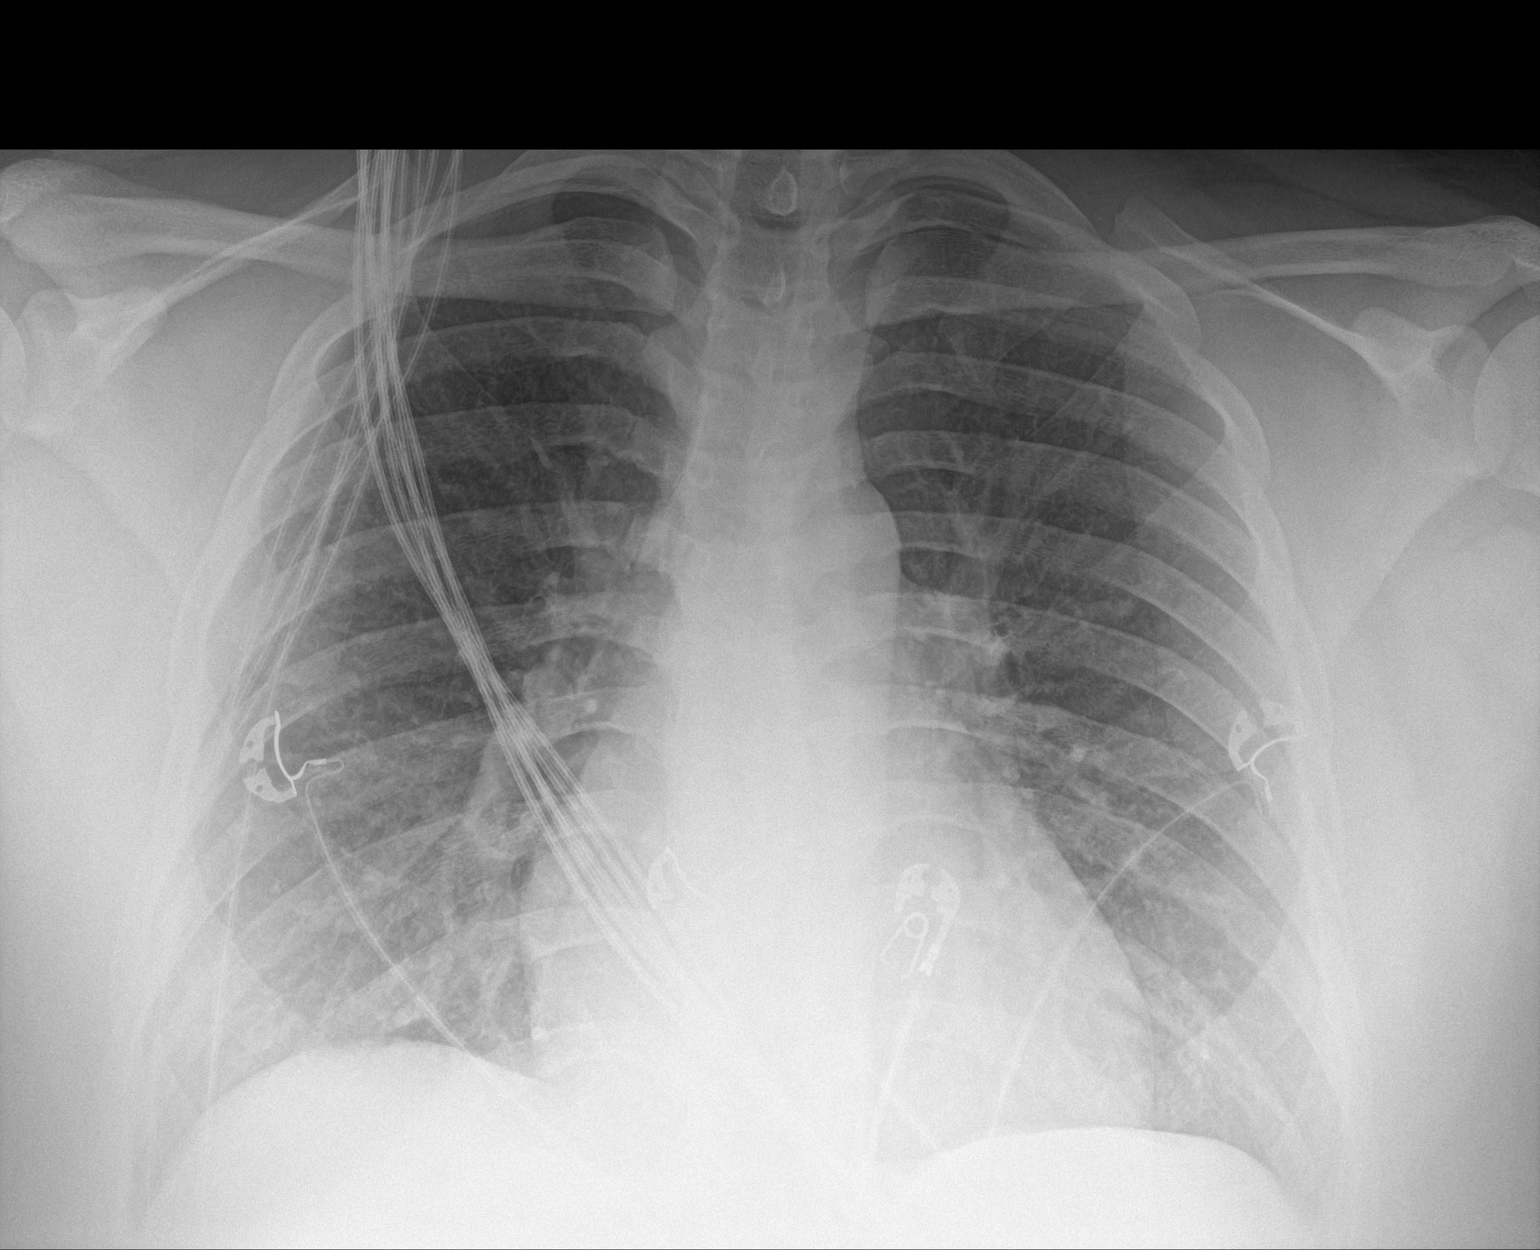

[1 of 1 positions shown; findings below may reference images not displayed]

FINDINGS: Artifact from EKG leads.

Normal heart size and mediastinal contours. No acute infiltrate or
edema. No effusion or pneumothorax. No acute osseous findings.
IMPRESSION: Negative portable chest.

## 2022-12-03 NOTE — Progress Notes (Unsigned)
Cardiology Office Note   Date:  12/05/2022   ID:  Hunter Simmons, DOB 12/23/1995, MRN [id number]  PCP:  Babs Sciara, MD  Cardiologist:   Dietrich Pates, MD   Patient presents for follow up of atrial fibrillation     History of Present Illness: Hunter Simmons is a 27 y.o. male with a history of atrial fibrillation, HTN, morbid obesity   In Dec 2022 the pt developed a URI. 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     Allergies:   Patient has no known allergies.   Past Medical History:  Diagnosis Date   Atrial fibrillation (HCC)    Morbid obesity (HCC)     Past Surgical History:  Procedure Laterality Date   WISDOM TOOTH EXTRACTION       Social History:  The patient  reports that he has never smoked. He has never used smokeless tobacco. He reports current alcohol use of about 1.0 standard drink of alcohol per week. He reports that he does not use drugs.   Family History:  The patient's family history includes Hypertension in his father.    ROS:  Please see the history of present illness. All other systems are reviewed and  Negative to the above problem except as noted.    PHYSICAL EXAM: VS:  BP (!)  144/82 (BP Location: Right Arm, Patient Position: Sitting, Cuff Size: Large)   Pulse 77   Ht 6' (1.829 m)   Wt 298 lb 9.6 oz (135.4 kg)   SpO2 98%   BMI 40.50 kg/m   GEN: Markedly obese 27 yo in no acute distress  HEENT: normal  Neck: JVP is normal  Cardiac: RRR; no murmur LE edema  Respiratory:  clear to auscultation  GI: soft, nontender, nondistended, + BS  No hepatomegaly  MS: no deformity Moving all extremities   Skin: warm and dry, no rash Neuro:  Strength and sensation are intact Psych: euthymic mood, full affect   EKG:  EKG is not ordered todY  Echo   02/14/21  eft ventricular ejection fraction, by estimation, is 55 to 60%. The left ventricle has normal function. The left ventricle has no regional wall motion abnormalities. Left ventricular diastolic parameters were normal. 1. 2. Right ventricular systolic function is normal. The right ventricular size is normal. 3. Left atrial size was mildly dilated. 4. The mitral valve is normal in structure. Trivial mitral valve regurgitation. 5. The  aortic valve is tricuspid. Aortic valve regurgitation is not visualized. The inferior vena cava is normal in size with greater than 50% respiratory variability, suggesting right atrial pressure of 3 mmHg.  Lipid Panel    Component Value Date/Time   CHOL 160 04/07/2021 0816   TRIG 60 04/07/2021 0816   HDL 41 04/07/2021 0816   CHOLHDL 3.9 04/07/2021 0816   LDLCALC 107 (H) 04/07/2021 0816      Wt Readings from Last 3 Encounters:  12/05/22 298 lb 9.6 oz (135.4 kg)  02/20/22 296 lb (134.3 kg)  02/13/22 293 lb 6.4 oz (133.1 kg)      ASSESSMENT AND PLAN:  1  PAF  Pt has had one documented spell of afib after use of stimulant use, when sick with a pneumonia.    CHADSVASc is 1(htn)   Echo normal Follow     2 HTN  BP is 110s to 140 at home   I have asked him to follow   Bring cuff and readings to clinic in 1 month  3  ? OSA  pt with hx of snoring   Will set up for home sleep  study    3  Diet  REviewed again  with pt  Cut out sugar drinks   Limit carbs and processed foods         Current medicines are reviewed at length with the patient today.  The patient does not have concerns regarding medicines.  Signed, Dietrich Pates, MD  12/05/2022 12:09 PM    [facility] [address] [place], [place], [place]  [address] Phone: [phone]; Fax: [phone]

## 2022-12-05 ENCOUNTER — Encounter: Payer: Self-pay | Admitting: Internal Medicine

## 2022-12-05 ENCOUNTER — Ambulatory Visit: Payer: No Typology Code available for payment source | Attending: Internal Medicine | Admitting: Internal Medicine

## 2022-12-05 VITALS — BP 144/82 | HR 77 | Ht 72.0 in | Wt 298.6 lb

## 2022-12-05 DIAGNOSIS — R0683 Snoring: Secondary | ICD-10-CM | POA: Diagnosis not present

## 2022-12-05 DIAGNOSIS — Z8679 Personal history of other diseases of the circulatory system: Secondary | ICD-10-CM | POA: Diagnosis not present

## 2022-12-05 NOTE — Patient Instructions (Signed)
Medication Instructions:  Your physician recommends that you continue on your current medications as directed. Please refer to the Current Medication list given to you today.  *If you need a refill on your cardiac medications before your next appointment, please call your pharmacy*   Lab Work: NONE   If you have labs (blood work) drawn today and your tests are completely normal, you will receive your results only by: MyChart Message (if you have MyChart) OR A paper copy in the mail If you have any lab test that is abnormal or we need to change your treatment, we will call you to review the results.   Testing/Procedures: Your physician has recommended that you have a sleep study. This test records several body functions during sleep, including: brain activity, eye movement, oxygen and carbon dioxide blood levels, heart rate and rhythm, breathing rate and rhythm, the flow of air through your mouth and nose, snoring, body muscle movements, and chest and belly movement.  Please monitor and record blood pressures on log and bring to nurse visit in 6 weeks.    Follow-Up: At [facility], you and your health needs are our priority.  As part of our continuing mission to provide you with exceptional heart care, we have created designated Provider Care Teams.  These Care Teams include your primary Cardiologist (physician) and Advanced Practice Providers (APPs -  Physician Assistants and Nurse Practitioners) who all work together to provide you with the care you need, when you need it.  We recommend signing up for the patient portal called "MyChart".  Sign up information is provided on this After Visit Summary.  MyChart is used to connect with patients for Virtual Visits (Telemedicine).  Patients are able to view lab/test results, encounter notes, upcoming appointments, etc.  Non-urgent messages can be sent to your provider as well.   To learn more about what you can do with MyChart, go to  [email or web address].    Your next appointment:    April   Provider:   You may see Dietrich Pates, MD or one of the following Advanced Practice Providers on your designated Care Team:   Randall An, PA-C  Jacolyn Reedy, PA-C     Other Instructions Thank you for choosing [facility]!

## 2023-01-16 ENCOUNTER — Ambulatory Visit: Payer: No Typology Code available for payment source | Attending: Internal Medicine

## 2023-01-16 DIAGNOSIS — I10 Essential (primary) hypertension: Secondary | ICD-10-CM | POA: Diagnosis not present

## 2023-01-16 NOTE — Progress Notes (Signed)
Patient here for nurse BP check per Dr.Ross  Patient presents with no weakness/ or SOB or CP  Patient BP today Is 128/72  Pulse 84 SPO2-97  Sent to Dr.Ross for review

## 2023-02-03 NOTE — Progress Notes (Signed)
Follow BP for now  He should have been set up for sleep study  I don't see the results

## 2023-03-01 NOTE — Telephone Encounter (Signed)
**Note De-Identified Priest Lockridge Obfuscation** Dr Tenny Craw, [place] Childes: The pts ins plan,  [employer], advised me that the pts coverage with them terminated on 02/06/2023.  Per the pt he has returned the [facility] One-HST device because he was advised that his ins will not cover it. Thanks, [employer]

## 2023-04-12 ENCOUNTER — Encounter: Payer: Self-pay | Admitting: *Deleted

## 2023-04-12 NOTE — Telephone Encounter (Signed)
 Left HIPAA-compliant message on VM requesting call back. Direct number provided.   Will clarify if pt if patient is currently insured. If uninsured, pt can call billing for a self-pay estimate.

## 2023-04-12 NOTE — Telephone Encounter (Signed)
-----   Message from Nurse Rolanda Lundborg sent at 03/18/2023  7:24 AM EST ----- Hunter Alexanders, Do you have any suggestions/recommendations for this pt with no Ins coverage to get a [employer] One-HST Device? Thanks, Larita Fife ----- Message ----- From: Pricilla Riffle, MD Sent: 03/15/2023  10:40 AM EST To: Lorelle Formosa Via, LPN  Sad that it wont cover   ? Where he can get a study done   Critical with PAF ----- Message ----- From: ViaLorelle Formosa, LPN Sent: 10/20/7827   7:40 AM EST To: Pricilla Riffle, MD; Karl Pock Pinnix, LPN

## 2023-04-19 NOTE — Telephone Encounter (Signed)
 Second attempt:  LMOVM requesting call back. Direct number provided.

## 2023-07-12 ENCOUNTER — Ambulatory Visit: Admitting: Physician Assistant

## 2023-07-12 VITALS — BP 126/74 | HR 81 | Temp 98.2°F | Ht 72.0 in | Wt 310.0 lb

## 2023-07-12 DIAGNOSIS — K219 Gastro-esophageal reflux disease without esophagitis: Secondary | ICD-10-CM | POA: Diagnosis not present

## 2023-07-12 MED ORDER — SUCRALFATE 1 G PO TABS: 1.0000 g | ORAL_TABLET | Freq: Three times a day (TID) | ORAL | 0 refills | Status: AC | PRN

## 2023-07-12 MED ORDER — OMEPRAZOLE 20 MG PO CPDR: 20.0000 mg | DELAYED_RELEASE_CAPSULE | Freq: Every day | ORAL | 3 refills | Status: AC

## 2023-07-12 NOTE — Assessment & Plan Note (Signed)
 Patient presents today with concerns of indigestion. Due to history, symptoms, and presentation, most likely cause of presentation is GERD. Will start patient on omeprazole daily and Carafate as needed for symptoms. We discussed avoiding acidic foods, eating no more than 3 hours before, increasing exercise, and avoiding triggers. Warning signs reviewed, patient to follow up if symptoms do not improve.

## 2023-07-12 NOTE — Patient Instructions (Addendum)
 Add in fruits or vegetables at each meal (avoid citrus). Opt for whole grains such as whole wheat, brown rice, oats, teff, millet, quinoa. Serve smaller portion sizes to help you reduce meal volume. Use smaller plates and utensils to feel satisfied with smaller amounts. Choose water or tea over high-sugar drinks. Eliminate carbonated beverages and caffeine if they trigger symptoms.  Finish eating approximately 3 hours before lying down. Avoid alcohol before bed. Elevate head of bed when sleeping, ideally using a wedge pillow or by adjusting mattress or head of bed. Lie on left side to minimize reflux.   Incorporate activity into lifestyle. If you track steps, aim for >7000 to 10,000 steps on most days (5 to 8 km). Accumulate 20 to 30 minutes of physical activity on most days of the week such as walking, swimming, dancing, exercise classes, or cleaning.

## 2023-07-12 NOTE — Progress Notes (Signed)
   Acute Office Visit  Subjective:     Patient ID: Hunter Simmons, male    DOB: September 25, 1995, 28 y.o.   MRN: [id number]   Patient presents today with complaints of indigestion. He does have a distant history of GERD. He states symptoms have been daily for the last 2-3 days, but previously were sporadic approximately once per month. He locates pain to epigastric/substernal area. He denies nausea, vomiting, abdominal pain, diarrhea, or constipation.      Review of Systems  Constitutional:  Positive for malaise/fatigue. Negative for fever.  Respiratory:  Negative for cough.   Cardiovascular:  Negative for chest pain.  Gastrointestinal:  Positive for heartburn. Negative for abdominal pain, constipation, diarrhea, nausea and vomiting.        Objective:     BP 126/74   Pulse 81   Temp 98.2 F (36.8 C)   Ht 6' (1.829 m)   Wt (!) 310 lb (140.6 kg)   SpO2 98%   BMI 42.04 kg/m   Physical Exam Constitutional:      Appearance: Normal appearance. He is obese.  HENT:     Head: Normocephalic and atraumatic.     Nose: Nose normal.     Mouth/Throat:     Mouth: Mucous membranes are moist.     Pharynx: Oropharynx is clear.  Cardiovascular:     Rate and Rhythm: Normal rate and regular rhythm.     Heart sounds: Normal heart sounds. No murmur heard. Pulmonary:     Effort: Pulmonary effort is normal.     Breath sounds: Normal breath sounds.  Abdominal:     General: Abdomen is flat. Bowel sounds are normal.     Palpations: Abdomen is soft.     Tenderness: There is no abdominal tenderness.  Skin:    General: Skin is warm and dry.  Neurological:     General: No focal deficit present.     Mental Status: He is alert.     No results found for any visits on 07/12/23.      Assessment & Plan:  Gastroesophageal reflux disease without esophagitis Assessment & Plan: Patient presents today with concerns of indigestion. Due to history, symptoms, and presentation, most likely cause of  presentation is GERD. Will start patient on omeprazole daily and Carafate as needed for symptoms. We discussed avoiding acidic foods, eating no more than 3 hours before, increasing exercise, and avoiding triggers. Warning signs reviewed, patient to follow up if symptoms do not improve.   Orders: -     Omeprazole; Take 1 capsule (20 mg total) by mouth daily.  Dispense: 30 capsule; Refill: 3 -     Sucralfate; Take 1 tablet (1 g total) by mouth 3 (three) times daily as needed.  Dispense: 30 tablet; Refill: 0   Return if symptoms worsen or fail to improve.  Jearlean Mince Chandlar Staebell, PA-C

## 2023-07-20 ENCOUNTER — Other Ambulatory Visit: Payer: Self-pay | Admitting: Physician Assistant

## 2023-07-20 DIAGNOSIS — K219 Gastro-esophageal reflux disease without esophagitis: Secondary | ICD-10-CM

## 2023-07-22 NOTE — Telephone Encounter (Signed)
 Third attempt:  LMOVM requesting call back regarding HST. Direct number provided.

## 2023-07-27 NOTE — Progress Notes (Deleted)
 Cardiology Office Note   Date:  07/27/2023   ID:  Hunter Simmons, DOB 1995-06-22, MRN [id number]  PCP:  Alphonsa Glendia DELENA, MD  Cardiologist:   Vina Gull, MD   Patient presents for follow up of atrial fibrillation     History of Present Illness: Hunter Simmons is a 28 y.o. male with a history of atrial fibrillation, HTN, morbid obesity   In Dec 2022 the pt developed a URI. 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     Allergies:   Patient has no known allergies.   Past Medical History:  Diagnosis Date   Atrial fibrillation (HCC)    Morbid obesity (HCC)     Past Surgical History:  Procedure Laterality Date   WISDOM TOOTH EXTRACTION       Social History:  The patient  reports that he has never smoked. He has never used smokeless tobacco. He reports current alcohol use of about 1.0 standard drink of alcohol per week. He reports that he does not use drugs.   Family History:  The patient's family history includes Hypertension in his father.    ROS:  Please see the history of present illness. All other systems are reviewed and  Negative to the above problem except as noted.     PHYSICAL EXAM: VS:  There were no vitals taken for this visit.  GEN: Markedly obese 28 yo in no acute distress  HEENT: normal  Neck: JVP is normal  Cardiac: RRR; no murmur LE edema  Respiratory:  clear to auscultation  GI: soft, nontender, nondistended, + BS  No hepatomegaly  MS: no deformity Moving all extremities   Skin: warm and dry, no rash Neuro:  Strength and sensation are intact Psych: euthymic mood, full affect   EKG:  EKG is not ordered todY  Echo   02/14/21  eft ventricular ejection fraction, by estimation, is 55 to 60%. The left ventricle has normal function. The left ventricle has no regional wall motion abnormalities. Left ventricular diastolic parameters were normal. 1. 2. Right ventricular systolic function is normal. The right ventricular size is normal. 3. Left atrial size was mildly dilated. 4. The mitral valve is normal in structure. Trivial mitral valve regurgitation. 5. The aortic valve is tricuspid. Aortic valve regurgitation is not visualized. The inferior vena cava is normal in size with greater than 50% respiratory variability, suggesting right atrial  pressure of 3 mmHg.  Lipid Panel    Component Value Date/Time   CHOL 160 04/07/2021 0816   TRIG 60 04/07/2021 0816   HDL 41 04/07/2021 0816   CHOLHDL 3.9 04/07/2021 0816   LDLCALC 107 (H) 04/07/2021 0816      Wt Readings from Last 3 Encounters:  07/12/23 (!) 310 lb (140.6 kg)  12/05/22 298 lb 9.6 oz (135.4 kg)  02/20/22 296 lb (134.3 kg)      ASSESSMENT AND PLAN:  1  PAF  Pt has had one documented spell of afib after use of stimulant use, when sick with a pneumonia.    CHADSVASc is 1(htn)   Echo normal Follow     2 HTN  BP is 110s to 140 at home   I have asked him to follow   Bring cuff and readings to clinic in 1 month  3  ? OSA  pt with hx of snoring   Will set up for home sleep study    3  Diet  REviewed again  with pt  Cut out sugar drinks   Limit carbs and processed foods          Current medicines are reviewed at length with the patient today.  The patient does not have concerns regarding medicines.  Signed, Vina Gull, MD  07/27/2023 11:06 PM    [facility] [address] [place], [place], [place]  [address] Phone: [phone]; Fax: [phone]

## 2023-08-02 ENCOUNTER — Ambulatory Visit: Admitting: Internal Medicine
